# Patient Record
Sex: Male | Born: 1985
Health system: Southern US, Community
[De-identification: ages and names within clinical notes are randomized; demographics above are authoritative.]

## PROBLEM LIST (undated history)

## (undated) DIAGNOSIS — I10 Essential (primary) hypertension: Secondary | ICD-10-CM

## (undated) DIAGNOSIS — F17200 Nicotine dependence, unspecified, uncomplicated: Secondary | ICD-10-CM

## (undated) HISTORY — DX: Essential (primary) hypertension: I10

## (undated) HISTORY — DX: Nicotine dependence, unspecified, uncomplicated: F17.200

---

## 2008-07-20 ENCOUNTER — Emergency Department (HOSPITAL_COMMUNITY): Admission: EM | Admit: 2008-07-20 | Discharge: 2008-07-20 | Payer: Self-pay | Admitting: Emergency Medicine

## 2013-02-25 ENCOUNTER — Ambulatory Visit: Payer: Self-pay | Admitting: Family Medicine

## 2013-02-25 ENCOUNTER — Encounter: Payer: Self-pay | Admitting: Family Medicine

## 2013-03-10 ENCOUNTER — Encounter: Payer: Self-pay | Admitting: Family Medicine

## 2013-03-10 ENCOUNTER — Ambulatory Visit (INDEPENDENT_AMBULATORY_CARE_PROVIDER_SITE_OTHER): Payer: 59 | Admitting: Family Medicine

## 2013-03-10 VITALS — BP 134/91 | HR 86 | Temp 98.2°F | Ht 71.5 in | Wt 271.0 lb

## 2013-03-10 DIAGNOSIS — M25512 Pain in left shoulder: Secondary | ICD-10-CM | POA: Insufficient documentation

## 2013-03-10 DIAGNOSIS — Z720 Tobacco use: Secondary | ICD-10-CM | POA: Insufficient documentation

## 2013-03-10 DIAGNOSIS — M25562 Pain in left knee: Secondary | ICD-10-CM | POA: Insufficient documentation

## 2013-03-10 DIAGNOSIS — F1010 Alcohol abuse, uncomplicated: Secondary | ICD-10-CM

## 2013-03-10 DIAGNOSIS — M25569 Pain in unspecified knee: Secondary | ICD-10-CM

## 2013-03-10 DIAGNOSIS — F1090 Alcohol use, unspecified, uncomplicated: Secondary | ICD-10-CM | POA: Insufficient documentation

## 2013-03-10 DIAGNOSIS — M25519 Pain in unspecified shoulder: Secondary | ICD-10-CM

## 2013-03-10 DIAGNOSIS — F17200 Nicotine dependence, unspecified, uncomplicated: Secondary | ICD-10-CM | POA: Insufficient documentation

## 2013-03-10 LAB — COMPREHENSIVE METABOLIC PANEL
ALT: 40 U/L (ref 0–53)
AST: 23 U/L (ref 0–37)
CO2: 26 mEq/L (ref 19–32)
Calcium: 9.3 mg/dL (ref 8.4–10.5)
Chloride: 103 mEq/L (ref 96–112)
Potassium: 3.6 mEq/L (ref 3.5–5.3)
Sodium: 137 mEq/L (ref 135–145)
Total Protein: 6.8 g/dL (ref 6.0–8.3)

## 2013-03-10 LAB — CBC WITH DIFFERENTIAL/PLATELET
Basophils Absolute: 0 10*3/uL (ref 0.0–0.1)
Basophils Relative: 0 % (ref 0–1)
Eosinophils Relative: 1 % (ref 0–5)
HCT: 41 % (ref 39.0–52.0)
Lymphocytes Relative: 16 % (ref 12–46)
MCHC: 35.6 g/dL (ref 30.0–36.0)
Monocytes Absolute: 0.9 10*3/uL (ref 0.1–1.0)
Neutro Abs: 7.1 10*3/uL (ref 1.7–7.7)
Platelets: 236 10*3/uL (ref 150–400)
RDW: 13.2 % (ref 11.5–15.5)
WBC: 9.6 10*3/uL (ref 4.0–10.5)

## 2013-03-10 MED ORDER — NAPROXEN 500 MG PO TABS: 500.0000 mg | ORAL_TABLET | Freq: Two times a day (BID) | ORAL | Status: DC

## 2013-03-10 NOTE — Progress Notes (Addendum)
Patient ID: Joel Li, male   DOB: 12-Jul-1985, 27 y.o.   MRN: [id number]  Kevin Fenton, MD Phone: [phone]  Subjective:  Chief complaint-noted  New patient  27 year old male here to establish care. His complaints today of left shoulder and left knee pain.  Alcohol abuse States that he drinks approximately one fifth of whiskey once or twice a week on the weekends. He denies any family history of alcoholism, and he is negative x4 on the cage questions. He's not considering cutting back.  Tobacco abuse He states he smoked about a pack a day for the last 5 years, he has a history of lung cancer and  several family members. He states he has cut back some and has smoked off and on for 5 years. He is definitely thinking about quitting and wants to try her on the first year.  Left shoulder pain States that he's had left shoulder pain described as a intermittent 6-7/10 sharp nonradiating pain under his left scapula. It's worsened by lifting, and helped minimally by aspirin or ibuprofen. 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 It's been bothering him for about 6 months but has not bothered him for the last 2 weeks.   Left knee pain States that he's had knee pain for years but it's worsened in the last 2 months. He complains of a stiffness and sharp pain. He describes it as a 7-8/10 nonradiating pain that's worse across the front of his knee just the note below his patella. It's worse at the end of the day after being on his feet all day.  He denies redness and warmth of the joint. He also denies exacerbation after a long no drinking  ROS- Per HPI  Past Medical History Patient Active Problem List   Diagnosis Date Noted  . Alcohol abuse 03/10/2013  . Tobacco abuse 03/10/2013  . Left anterior knee pain 03/10/2013  . Left shoulder pain 03/10/2013    Medications- reviewed and updated Current Outpatient Prescriptions  Medication Sig Dispense  Refill  . naproxen (NAPROSYN) 500 MG tablet Take 1 tablet (500 mg total) by mouth 2 (two) times daily with a meal.  30 tablet  0   No current facility-administered medications for this visit.    Objective: BP 134/91  Pulse 86  Temp(Src) 98.2 F (36.8 C) (Oral)  Ht 5' 11.5" (1.816 m)  Wt 271 lb (122.925 kg)  BMI 37.27 kg/m2 Gen: NAD, alert, cooperative with exam HEENT: NCAT, EOMI, PERRL CV: RRR, good S1/S2, no murmur Resp: CTABL, no wheezes, non-labored Abd: SNTND, BS present, no guarding or organomegaly Ext: No edema, warm Neuro: Alert and oriented, No gross deficits MSK L shoulder full ROM, NO joint luine tenderness, no tenderness to palpation, strength 5/5 in all directions L KNee: No effusion, redness, or warmth, Full ROM, No Joint laxity, no tenderness to palpation   Assessment/Plan:  Alcohol abuse Counseled for this it level of alcohol use is risky, and consider alcohol abuse Discussed cutting back and offered support for the future. Check CMP  Left anterior knee pain Consistent with patellar tendinitis, explain that this would likely benefit from quadriceps strengthening, rest, ice, compression, and elevation. Recommended scheduled NSAIDs for one week. Recommended purchasing a knee sleeve to support his knee while he is at work. Will followup in one month to see if the pains improved, consider referral sports med if no relief then.  Left shoulder pain Currently not hurting him. Would consider overuse  tendinitis as likely cause Discussed rice protocol and scheduled the NSAIDs for now per knee pain. We'll follow  Tobacco abuse Contemplative, planning to quit Offered pharmacologic support which will think about.    Orders Placed This Encounter  Procedures  . Comprehensive metabolic panel  . CBC with Differential    Meds ordered this encounter  Medications  . naproxen (NAPROSYN) 500 MG tablet    Sig: Take 1 tablet (500 mg total) by mouth 2 (two) times  daily with a meal.    Dispense:  30 tablet    Refill:  0

## 2013-03-10 NOTE — Assessment & Plan Note (Signed)
Counseled for this it level of alcohol use is risky, and consider alcohol abuse Discussed cutting back and offered support for the future. Check CMP

## 2013-03-10 NOTE — Patient Instructions (Signed)
It was great to meet you!  Lets follow up in 1 month  I will call you if any of your labs are alarming  Try ice every 2 hours for 20 minutes for kneepain and swelling  Try a sleeve for your knee, especially at work   Take the naproxen twice daily for 7 days, then as needed.

## 2013-03-10 NOTE — Assessment & Plan Note (Signed)
Contemplative, planning to quit Offered pharmacologic support which will think about.

## 2013-03-10 NOTE — Assessment & Plan Note (Signed)
Consistent with patellar tendinitis, explain that this would likely benefit from quadriceps strengthening, rest, ice, compression, and elevation. Recommended scheduled NSAIDs for one week. Recommended purchasing a knee sleeve to support his knee while he is at work. Will followup in one month to see if the pains improved, consider referral sports med if no relief then.

## 2013-03-10 NOTE — Assessment & Plan Note (Signed)
Currently not hurting him. Would consider overuse tendinitis as likely cause Discussed rice protocol and scheduled the NSAIDs for now per knee pain. We'll follow

## 2013-03-16 ENCOUNTER — Encounter: Payer: Self-pay | Admitting: Family Medicine

## 2013-04-10 ENCOUNTER — Ambulatory Visit: Payer: 59 | Admitting: Family Medicine

## 2015-01-12 ENCOUNTER — Encounter: Payer: Self-pay | Admitting: Podiatry

## 2015-01-12 ENCOUNTER — Ambulatory Visit (INDEPENDENT_AMBULATORY_CARE_PROVIDER_SITE_OTHER): Payer: 59 | Admitting: Podiatry

## 2015-01-12 VITALS — BP 168/101 | HR 88 | Resp 12

## 2015-01-12 DIAGNOSIS — B353 Tinea pedis: Secondary | ICD-10-CM | POA: Diagnosis not present

## 2015-01-12 MED ORDER — TERBINAFINE HCL 250 MG PO TABS: 250.0000 mg | ORAL_TABLET | Freq: Every day | ORAL | Status: DC

## 2015-01-12 MED ORDER — ECONAZOLE NITRATE 1 % EX CREA: TOPICAL_CREAM | Freq: Every day | CUTANEOUS | Status: DC

## 2015-01-12 NOTE — Progress Notes (Signed)
   Subjective:    Patient ID: Joel Li, male    DOB: [birth year], 29 y.o.   MRN: [id number]  HPI   This patient presents today complaining of itchy, sweaty, scaling feet on right and left feet for the past 1 month. He says the itching and scaling has increased dramatically over the past month. This more annoying when he wears "shoes he says that he said previous itchy blisters but now they have resolved and he has more scaling in his feet. When questioned about topical medication he said he use moisturizing medication not antifungal cream she denies any other areas of these lesions other than his feet.  Review of Systems  Skin: Positive for color change and rash.       Objective:   Physical Exam  Orientated 3  Vascular: No peripheral edema noted bilaterally DP and PT pulses 2/4 bilaterally Capillary reflex immediate bilaterally  Neurological: Sensation to 10 g monofilament wire intact 4/5 right bilaterally Vibratory sensation intact bilaterally Ankle reflex equal and reactive bilaterally  Dermatological: Dry scaling skin plantarly bilaterally. The scaling is over an inflammatory base. There are no open wounds noted bilaterally the scaly encompasses the entire plantar aspect of the feet as well as the plantar toe areas  Musculoskeletal: No deformities noted bilaterally      Assessment & Plan:   Assessment: Tinea pedis, bilaterally  Plan: Rx terbinafine 250 mg #30 no refills one daily Rx Spectazole 85 g apply daily to skin on feet 30 days  Patient advised to return if symptoms do not resolve after he completes oral and topical medications

## 2015-01-12 NOTE — Patient Instructions (Signed)
Take oral terbinafine 250 mg once daily 30 days Apply topical cream once daily to the bottoms right and left feet 30 days

## 2018-12-08 ENCOUNTER — Other Ambulatory Visit: Payer: Self-pay

## 2018-12-08 ENCOUNTER — Ambulatory Visit (HOSPITAL_COMMUNITY)
Admission: EM | Admit: 2018-12-08 | Discharge: 2018-12-08 | Disposition: A | Discharge status: Home / Self Care | Payer: BC Managed Care – PPO | Attending: Family Medicine | Admitting: Family Medicine

## 2018-12-08 ENCOUNTER — Encounter (HOSPITAL_COMMUNITY): Payer: Self-pay

## 2018-12-08 DIAGNOSIS — R109 Unspecified abdominal pain: Secondary | ICD-10-CM

## 2018-12-08 DIAGNOSIS — R030 Elevated blood-pressure reading, without diagnosis of hypertension: Secondary | ICD-10-CM | POA: Diagnosis not present

## 2018-12-08 LAB — POCT URINALYSIS DIP (DEVICE)
Bilirubin Urine: NEGATIVE
Glucose, UA: NEGATIVE mg/dL
Hgb urine dipstick: NEGATIVE
Ketones, ur: NEGATIVE mg/dL
Leukocytes,Ua: NEGATIVE
Nitrite: NEGATIVE
Protein, ur: NEGATIVE mg/dL
Specific Gravity, Urine: >=1.03 (ref 1.005–1.030)
Urobilinogen, UA: 0.2 mg/dL (ref 0.0–1.0)
pH: 6 (ref 5.0–8.0)

## 2018-12-08 MED ORDER — IBUPROFEN 600 MG PO TABS: 600.0000 mg | ORAL_TABLET | Freq: Four times a day (QID) | ORAL | 0 refills | Status: DC | PRN

## 2018-12-08 NOTE — ED Provider Notes (Addendum)
[facility]    CSN: [id number] Arrival date & time: 12/08/18  [id number]      History   Chief Complaint Chief Complaint  Patient presents with  . Flank Pain    HPI Joel Li is a 33 y.o. male.   Patient presents with 2 to 3-week history of left side rib pain.  He states this has occurred intermittently over the last 3 months.  No known falls or injury.  He denies cough, shortness of breath, dysuria, abdominal pain, back pain, fever, chills, or other symptoms.  The history is provided by the patient.    History reviewed. No pertinent past medical history.  Patient Active Problem List   Diagnosis Date Noted  . Alcohol abuse 03/10/2013  . Tobacco abuse 03/10/2013  . Left anterior knee pain 03/10/2013  . Left shoulder pain 03/10/2013    History reviewed. No pertinent surgical history.     Home Medications    Prior to Admission medications   Medication Sig Start Date End Date Taking? Authorizing Provider  econazole nitrate 1 % cream Apply topically daily. Dispense 85 g tube 01/12/15   Tuchman, Richard C, DPM  ibuprofen (ADVIL) 600 MG tablet Take 1 tablet (600 mg total) by mouth every 6 (six) hours as needed. 12/08/18   Mickie Bailate, Lauralye Kinn H, NP  terbinafine (LAMISIL) 250 MG tablet Take 1 tablet (250 mg total) by mouth daily. 01/12/15   Carrington Clampuchman, Richard C, DPM    Family History Family History  Problem Relation Age of Onset  . Lung cancer Mother   . Lung cancer Father   . Hyperlipidemia Father     Social History Social History   Tobacco Use  . Smoking status: Current Every Day Smoker    Packs/day: 1.00    Types: Cigarettes  . Smokeless tobacco: Never Used  Substance Use Topics  . Alcohol use: Yes    Comment: weekly  . Drug use: No     Allergies   Penicillins   Review of Systems Review of Systems  Constitutional: Negative for chills and fever.  HENT: Negative for ear pain and sore throat.   Eyes: Negative for pain and visual disturbance.   Respiratory: Negative for cough and shortness of breath.   Cardiovascular: Negative for chest pain and palpitations.  Gastrointestinal: Negative for abdominal pain and vomiting.  Genitourinary: Positive for flank pain. Negative for discharge, dysuria, hematuria and testicular pain.  Musculoskeletal: Negative for arthralgias and back pain.  Skin: Negative for color change and rash.  Neurological: Negative for seizures and syncope.  All other systems reviewed and are negative.    Physical Exam Triage Vital Signs ED Triage Vitals  Enc Vitals Group     BP 12/08/18 0922 (!) 178/116     Pulse Rate 12/08/18 0922 89     Resp 12/08/18 0922 16     Temp 12/08/18 0922 98.1 F (36.7 C)     Temp Source 12/08/18 0922 Oral     SpO2 12/08/18 0922 98 %     Weight --      Height --      Head Circumference --      Peak Flow --      Pain Score 12/08/18 0920 4     Pain Loc --      Pain Edu? --      Excl. in GC? --    No data found.  Updated Vital Signs BP (!) 178/116 (BP Location: Left Arm)   Pulse 89  Temp 98.1 F (36.7 C) (Oral)   Resp 16   SpO2 98%   Visual Acuity Right Eye Distance:   Left Eye Distance:   Bilateral Distance:    Right Eye Near:   Left Eye Near:    Bilateral Near:     Physical Exam Vitals signs and nursing note reviewed.  Constitutional:      Appearance: He is well-developed.  HENT:     Head: Normocephalic and atraumatic.     Mouth/Throat:     Mouth: Mucous membranes are moist.     Pharynx: Oropharynx is clear.  Eyes:     Conjunctiva/sclera: Conjunctivae normal.  Neck:     Musculoskeletal: Neck supple.  Cardiovascular:     Rate and Rhythm: Normal rate and regular rhythm.     Heart sounds: Normal heart sounds. No murmur.  Pulmonary:     Effort: Pulmonary effort is normal. No respiratory distress.     Breath sounds: Normal breath sounds. No wheezing or rhonchi.  Abdominal:     General: Bowel sounds are normal.     Palpations: Abdomen is soft.      Tenderness: There is no abdominal tenderness. There is no right CVA tenderness, left CVA tenderness, guarding or rebound.  Musculoskeletal:        General: Tenderness present. No swelling or deformity.       Arms:     Comments: Mildly tender to palpation of right lower ribs.  Skin:    General: Skin is warm and dry.     Findings: No bruising, erythema or rash.  Neurological:     General: No focal deficit present.     Mental Status: He is alert and oriented to person, place, and time.     Sensory: No sensory deficit.     Motor: No weakness.      UC Treatments / Results  Labs (all labs ordered are listed, but only abnormal results are displayed) Labs Reviewed  POCT URINALYSIS DIP (DEVICE)    EKG   Radiology No results found.  Procedures Procedures (including critical care time)  Medications Ordered in UC Medications - No data to display  Initial Impression / Assessment and Plan / UC Course  I have reviewed the triage vital signs and the nursing notes.  Pertinent labs & imaging results that were available during my care of the patient were reviewed by me and considered in my medical decision making (see chart for details).   Right flank pain.  Elevated blood pressure without diagnosis of hypertension.  Urine dip negative.  Treating pain with ibuprofen.  Instructed patient to follow-up with his PCP or the one listed to have his blood pressure rechecked.  Instructed patient to go to the emergency department if he has worsening pain, will or develops new symptoms such as dizziness, palpitations, chest pain, shortness of breath.  Patient agrees with plan of care.     Final Clinical Impressions(s) / UC Diagnoses   Final diagnoses:  Flank pain  Elevated blood-pressure reading without diagnosis of hypertension     Discharge Instructions     Take the prescribed ibuprofen as needed for your discomfort.    Your blood pressure is elevated today at 178/116.  Please have this  rechecked by your primary care provider this week.  If you do not have a primary care provider, one is suggested below.    Go to the emergency department if you have worsening pain, or develop new symptoms such as dizziness, heart palpitations,  chest pain, shortness of breath, or other concerns.         ED Prescriptions    Medication Sig Dispense Auth. Provider   ibuprofen (ADVIL) 600 MG tablet Take 1 tablet (600 mg total) by mouth every 6 (six) hours as needed. 30 tablet Mickie Bail, NP     Controlled Substance Prescriptions [place] Controlled Substance Registry consulted? Not Applicable   Mickie Bail, NP 12/08/18 1000    Mickie Bail, NP 12/08/18 1049

## 2018-12-08 NOTE — Discharge Instructions (Signed)
Take the prescribed ibuprofen as needed for your discomfort.    Your blood pressure is elevated today at 178/116.  Please have this rechecked by your primary care provider this week.  If you do not have a primary care provider, one is suggested below.    Go to the emergency department if you have worsening pain, or develop new symptoms such as dizziness, heart palpitations, chest pain, shortness of breath, or other concerns.

## 2018-12-08 NOTE — ED Triage Notes (Signed)
Patient presents to Urgent Care with complaints of left flank pain since 2-3 weeks ago. Patient reports the original injury was 2-3 months ago but now it has been more constant, worse with movement.

## 2018-12-10 ENCOUNTER — Encounter: Payer: Self-pay | Admitting: Internal Medicine

## 2018-12-10 ENCOUNTER — Ambulatory Visit: Payer: BC Managed Care – PPO | Admitting: Internal Medicine

## 2018-12-10 ENCOUNTER — Other Ambulatory Visit: Payer: Self-pay

## 2018-12-10 VITALS — BP 151/107 | HR 86 | Temp 98.2°F | Ht 69.0 in | Wt 321.2 lb

## 2018-12-10 DIAGNOSIS — E669 Obesity, unspecified: Secondary | ICD-10-CM

## 2018-12-10 DIAGNOSIS — R0781 Pleurodynia: Secondary | ICD-10-CM | POA: Diagnosis not present

## 2018-12-10 DIAGNOSIS — Z6841 Body Mass Index (BMI) 40.0 and over, adult: Secondary | ICD-10-CM | POA: Diagnosis not present

## 2018-12-10 DIAGNOSIS — Z79899 Other long term (current) drug therapy: Secondary | ICD-10-CM

## 2018-12-10 DIAGNOSIS — Z0000 Encounter for general adult medical examination without abnormal findings: Secondary | ICD-10-CM | POA: Insufficient documentation

## 2018-12-10 DIAGNOSIS — Z720 Tobacco use: Secondary | ICD-10-CM

## 2018-12-10 DIAGNOSIS — I10 Essential (primary) hypertension: Secondary | ICD-10-CM

## 2018-12-10 LAB — POCT GLYCOSYLATED HEMOGLOBIN (HGB A1C): Hemoglobin A1C: 5.2 % (ref 4.0–5.6)

## 2018-12-10 LAB — GLUCOSE, CAPILLARY: Glucose-Capillary: 99 mg/dL (ref 70–99)

## 2018-12-10 MED ORDER — LOSARTAN POTASSIUM 25 MG PO TABS: 25.0000 mg | ORAL_TABLET | Freq: Every day | ORAL | 0 refills | Status: DC

## 2018-12-10 NOTE — Patient Instructions (Addendum)
It was a pleasure to see you today Joel Li. Please make the following changes:  During this visit you were diagnosed with hypertension. I have started you on a medication called losartan 25mg  which I would like for you to take daily. Please make sure to exercise daily and eat healthy. You will be called from our nutritionist. Please follow up in 2 weeks.   If you have any questions or concerns, please call our clinic at [phone] between 9am-5pm and after hours call [phone] and ask for the internal medicine resident on call. If you feel you are having a medical emergency please call 911.   Thank you, we look forward to help you remain healthy!  Lars Mage, MD Internal Medicine PGY3   Losartan tablets What is this medicine? LOSARTAN (loe SAR tan) is used to treat high blood pressure and to reduce the risk of stroke in certain patients. This drug also slows the progression of kidney disease in patients with diabetes. This medicine may be used for other purposes; ask your health care provider or pharmacist if you have questions. COMMON BRAND NAME(S): Cozaar What should I tell my health care provider before I take this medicine? They need to know if you have any of these conditions:  heart failure  kidney or liver disease  an unusual or allergic reaction to losartan, other medicines, foods, dyes, or preservatives  pregnant or trying to get pregnant  breast-feeding How should I use this medicine? Take this medicine by mouth with a glass of water. Follow the directions on the prescription label. This medicine can be taken with or without food. Take your doses at regular intervals. Do not take your medicine more often than directed. Talk to your pediatrician regarding the use of this medicine in children. Special care may be needed. Overdosage: If you think you have taken too much of this medicine contact a poison control center or emergency room at once. NOTE: This medicine is  only for you. Do not share this medicine with others. What if I miss a dose? If you miss a dose, take it as soon as you can. If it is almost time for your next dose, take only that dose. Do not take double or extra doses. What may interact with this medicine?  blood pressure medicines  diuretics, especially triamterene, spironolactone, or amiloride  fluconazole  NSAIDs, medicines for pain and inflammation, like ibuprofen or naproxen  potassium salts or potassium supplements  rifampin This list may not describe all possible interactions. Give your health care provider a list of all the medicines, herbs, non-prescription drugs, or dietary supplements you use. Also tell them if you smoke, drink alcohol, or use illegal drugs. Some items may interact with your medicine. What should I watch for while using this medicine? Visit your doctor or health care professional for regular checks on your progress. Check your blood pressure as directed. Ask your doctor or health care professional what your blood pressure should be and when you should contact him or her. Call your doctor or health care professional if you notice an irregular or fast heart beat. Women should inform their doctor if they wish to become pregnant or think they might be pregnant. There is a potential for serious side effects to an unborn child, particularly in the second or third trimester. Talk to your health care professional or pharmacist for more information. You may get drowsy or dizzy. Do not drive, use machinery, or do anything that needs mental alertness  until you know how this drug affects you. Do not stand or sit up quickly, especially if you are an older patient. This reduces the risk of dizzy or fainting spells. Alcohol can make you more drowsy and dizzy. Avoid alcoholic drinks. Avoid salt substitutes unless you are told otherwise by your doctor or health care professional. Do not treat yourself for coughs, colds, or pain  while you are taking this medicine without asking your doctor or health care professional for advice. Some ingredients may increase your blood pressure. What side effects may I notice from receiving this medicine? Side effects that you should report to your doctor or health care professional as soon as possible:  confusion, dizziness, light headedness or fainting spells  decreased amount of urine passed  difficulty breathing or swallowing, hoarseness, or tightening of the throat  fast or irregular heart beat, palpitations, or chest pain  skin rash, itching  swelling of your face, lips, tongue, hands, or feet Side effects that usually do not require medical attention (report to your doctor or health care professional if they continue or are bothersome):  cough  decreased sexual function or desire  headache  nasal congestion or stuffiness  nausea or stomach pain  sore or cramping muscles This list may not describe all possible side effects. Call your doctor for medical advice about side effects. You may report side effects to FDA at 1-800-FDA-1088. Where should I keep my medicine? Keep out of the reach of children. Store at room temperature between 15 and 30 degrees C (59 and 86 degrees F). Protect from light. Keep container tightly closed. Throw away any unused medicine after the expiration date. NOTE: This sheet is a summary. It may not cover all possible information. If you have questions about this medicine, talk to your doctor, pharmacist, or health care provider.  2020 Elsevier/Gold Standard (2007-05-30 16:42:18)

## 2018-12-10 NOTE — Progress Notes (Deleted)
   CC: Hypertension management  HPI:  Mr.San Gurganus is a 33 y.o. with no known past medical history presents to establish care for management of hypertension. Please see problem based charting for evaluation, assessment, and plan.  The patient was seen in [facility] emergency room on 12/08/18 for 3 month history of left sided rib pain that was thought to be due to muscle spasm. During the visit the patient's blood pressure was elevated at 178/116.   Assessment and plan  The patient has two readings of elevated blood pressure separated in time which leads to a diagnosis of hypertension. Will check for comorbidity: diabetes, electrolyte changes, renal dysfunction, hepatic dysfunction.   Secondary causes of hypertension include obesity, tobacco use.   -cmp -cbc -a1c -smoking cessation   Healthcare main -influenza vaccine was given during this encounter   No past medical history on file.    Review of Systems:  ***  Physical Exam:  There were no vitals filed for this visit. ***  Assessment & Plan:   See Encounters Tab for problem based charting.  Patient {GC/GE:3044014::"discussed with","seen with"} Dr. {NAMES:[id number]::"Butcher","Granfortuna","E. Hoffman","Mullen","Narendra","Raines","Vincent"}

## 2018-12-10 NOTE — Progress Notes (Signed)
New Patient Office Visit  Subjective:  Patient ID: Joel Li, male    DOB: [birth year]  Age: 33 y.o. MRN: [id number]  CC:  Chief Complaint  Patient presents with  . Hypertension    HPI Matther Labell presents for with no known past medical history presents to establish care for management of hypertension. Please see problem based charting for evaluation, assessment, and plan.  No past medical history on file.  No past surgical history on file.  Family History  Problem Relation Age of Onset  . Lung cancer Mother   . Lung cancer Father   . Hyperlipidemia Father   just father lung cancer  No htn  No dm  Brother with clotting disorder   Social History   Socioeconomic History  . Marital status: Single    Spouse name: Not on file  . Number of children: Not on file  . Years of education: Not on file  . Highest education level: Not on file  Occupational History  . Not on file  Social Needs  . Financial resource strain: Not on file  . Food insecurity    Worry: Not on file    Inability: Not on file  . Transportation needs    Medical: Not on file    Non-medical: Not on file  Tobacco Use  . Smoking status: Current Every Day Smoker    Packs/day: 1.00    Types: Cigarettes  . Smokeless tobacco: Never Used  Substance and Sexual Activity  . Alcohol use: Yes    Comment: weekly  . Drug use: No  . Sexual activity: Not on file  Lifestyle  . Physical activity    Days per week: Not on file    Minutes per session: Not on file  . Stress: Not on file  Relationships  . Social Herbalist on phone: Not on file    Gets together: Not on file    Attends religious service: Not on file    Active member of club or organization: Not on file    Attends meetings of clubs or organizations: Not on file    Relationship status: Not on file  . Intimate partner violence    Fear of current or ex partner: Not on file    Emotionally abused: Not on file    Physically abused: Not  on file    Forced sexual activity: Not on file  Other Topics Concern  . Not on file  Social History Narrative  . Not on file   1ppd for the past 63yrs  Tequila once week approx 5-6 shots  ivdu none   Works as Freight forwarder at [employer] [place]  Constitutional: Negative for chills and fever.  Respiratory: Negative for apnea and chest tightness.   Cardiovascular: Negative for chest pain.  Gastrointestinal: Negative for abdominal pain.  Musculoskeletal: Positive for back pain.  Neurological: Negative for dizziness.  Psychiatric/Behavioral: Negative for agitation, behavioral problems and confusion.    Objective:   Today's Vitals: BP (!) 162/117 (BP Location: Left Arm, Patient Position: Sitting, Cuff Size: Large)   Pulse 93   Temp 98.2 F (36.8 C) (Oral)   Ht 5\' 9"  (1.753 m)   Wt (!) 321 lb 3.2 oz (145.7 kg)   SpO2 98% Comment: room air  BMI 47.43 kg/m   Physical Exam  Constitutional: Appears well-developed and well-nourished. No distress.  HENT:  Head: Normocephalic and atraumatic.  Eyes: Conjunctivae are normal.  Cardiovascular: Normal  rate, regular rhythm and normal heart sounds.  Respiratory: Effort normal and breath sounds normal. No respiratory distress. No wheezes.  GI: Soft. Bowel sounds are normal. No distension. There is no tenderness.  Musculoskeletal: No edema.  Neurological: Is alert.  Skin: Not diaphoretic. No erythema.  Psychiatric: Normal mood and affect. Behavior is normal. Judgment and thought content normal.    Assessment & Plan:   Problem List Items Addressed This Visit    None      Outpatient Encounter Medications as of 12/10/2018  Medication Sig  . econazole nitrate 1 % cream Apply topically daily. Dispense 85 g tube  . ibuprofen (ADVIL) 600 MG tablet Take 1 tablet (600 mg total) by mouth every 6 (six) hours as needed.  . terbinafine (LAMISIL) 250 MG tablet Take 1 tablet (250 mg total) by mouth daily.   No  facility-administered encounter medications on file as of 12/10/2018.    Discussed with Dr. Heide SparkNarendra   Follow-up: No follow-ups on file.   Lorenso CourierVahini Raynell Scott, MD Internal Medicine PGY3 [phone] 12/10/2018, 11:44 AM

## 2018-12-10 NOTE — Assessment & Plan Note (Signed)
The patient was seen in [facility] emergency room on 12/08/18 for 3 month history of left sided rib pain that was thought to be due to muscle spasm. During the visit the patient's blood pressure was elevated at 178/116. During today's visit the patient's blood pressure is 160/117 and repeat 151/107  Assessment and plan  The patient has two readings of elevated blood pressure separated in time which leads to a diagnosis of hypertension. Will check for comorbidity: diabetes, electrolyte changes, renal dysfunction, hepatic dysfunction.   Secondary causes of hypertension include obesity, tobacco use.   -cmp -cbc -a1c -smoking cessation  -Referral to nutrition -started losartan 25mg  qd

## 2018-12-10 NOTE — Assessment & Plan Note (Signed)
Patient is anxious about getting influenza vaccine and said he may consider getting it at next visit

## 2018-12-11 LAB — CBC
Hematocrit: 42.6 % (ref 37.5–51.0)
Hemoglobin: 14.8 g/dL (ref 13.0–17.7)
MCH: 30.6 pg (ref 26.6–33.0)
MCHC: 34.7 g/dL (ref 31.5–35.7)
MCV: 88 fL (ref 79–97)
Platelets: 264 10*3/uL (ref 150–450)
RBC: 4.83 x10E6/uL (ref 4.14–5.80)
RDW: 12.6 % (ref 11.6–15.4)
WBC: 8.2 10*3/uL (ref 3.4–10.8)

## 2018-12-11 LAB — CMP14 + ANION GAP
ALT: 35 IU/L (ref 0–44)
AST: 18 IU/L (ref 0–40)
Albumin/Globulin Ratio: 1.6 (ref 1.2–2.2)
Albumin: 4.2 g/dL (ref 4.0–5.0)
Alkaline Phosphatase: 89 IU/L (ref 39–117)
Anion Gap: 15 mmol/L (ref 10.0–18.0)
BUN/Creatinine Ratio: 19 (ref 9–20)
BUN: 14 mg/dL (ref 6–20)
Bilirubin Total: 0.3 mg/dL (ref 0.0–1.2)
CO2: 22 mmol/L (ref 20–29)
Calcium: 9.4 mg/dL (ref 8.7–10.2)
Chloride: 105 mmol/L (ref 96–106)
Creatinine, Ser: 0.74 mg/dL — ABNORMAL LOW (ref 0.76–1.27)
GFR calc Af Amer: 139 mL/min/{1.73_m2} (ref >59)
GFR calc non Af Amer: 120 mL/min/{1.73_m2} (ref >59)
Globulin, Total: 2.6 g/dL (ref 1.5–4.5)
Glucose: 99 mg/dL (ref 65–99)
Potassium: 4.1 mmol/L (ref 3.5–5.2)
Sodium: 142 mmol/L (ref 134–144)
Total Protein: 6.8 g/dL (ref 6.0–8.5)

## 2018-12-11 NOTE — Progress Notes (Signed)
Internal Medicine Clinic Attending  Case discussed with Dr. Chundi at the time of the visit.  We reviewed the resident's history and exam and pertinent patient test results.  I agree with the assessment, diagnosis, and plan of care documented in the resident's note. 

## 2018-12-24 ENCOUNTER — Encounter: Payer: BC Managed Care – PPO | Admitting: Dietician

## 2019-01-16 ENCOUNTER — Encounter: Payer: Self-pay | Admitting: Internal Medicine

## 2019-01-16 ENCOUNTER — Encounter: Payer: Self-pay | Admitting: Dietician

## 2019-01-16 ENCOUNTER — Ambulatory Visit (INDEPENDENT_AMBULATORY_CARE_PROVIDER_SITE_OTHER): Payer: BC Managed Care – PPO | Admitting: Dietician

## 2019-01-16 ENCOUNTER — Ambulatory Visit: Payer: BC Managed Care – PPO | Admitting: Internal Medicine

## 2019-01-16 ENCOUNTER — Other Ambulatory Visit: Payer: Self-pay

## 2019-01-16 VITALS — BP 157/96 | HR 94 | Wt 318.4 lb

## 2019-01-16 DIAGNOSIS — Z720 Tobacco use: Secondary | ICD-10-CM

## 2019-01-16 DIAGNOSIS — Z713 Dietary counseling and surveillance: Secondary | ICD-10-CM

## 2019-01-16 DIAGNOSIS — Z6841 Body Mass Index (BMI) 40.0 and over, adult: Secondary | ICD-10-CM | POA: Diagnosis not present

## 2019-01-16 DIAGNOSIS — I10 Essential (primary) hypertension: Secondary | ICD-10-CM

## 2019-01-16 NOTE — Patient Instructions (Signed)
Hi Mr. Flammer,  Dennis Bast are doing a great job making lifestyle changes!  I am happy to support you and your family.  I like your idea of eating something in the morning for energy with you coffee and snacking and eating more fruit and veggies to increase your potassium intake to lower your blood pressure.  Please make an appointment for follow up in the same day you see the doctor. It is suggested to have 2-4 visits in 6 months to help with making changes more lasting.   Butch Penny [phone]

## 2019-01-16 NOTE — Assessment & Plan Note (Signed)
Discussed smoking cessation. Discussed nicotine replacement. Patient is not ready to quit today.

## 2019-01-16 NOTE — Progress Notes (Signed)
  Medical Nutrition Therapy :  Appt start time: 0945 end time:  1025. 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 He helps with the food shopping and cooking at home. He has recently started drinking water and some juice instead of soda and this has resulted in weight loss Preferred Learning Style: No preference indicated  Learning Readiness: Change in progress  ANTHROPOMETRICS: Estimated body mass index is 47.02 kg/m as calculated from the following:   Height as of 12/10/18: _0  (1.753 m).   Weight as of an earlier encounter on 01/16/19: 318 lb 6.4 oz (144.4 kg).  WEIGHT HISTORY:  Wt Readings from Last 5 Encounters:  01/16/19 (!) 318 lb 6.4 oz (144.4 kg)  12/10/18 (!) 321 lb 3.2 oz (145.7 kg)  03/10/13 271 lb (122.9 kg)    SLEEP: need to assess at future visit  MEDICATIONS:  Current Outpatient Medications on File Prior to Visit  Medication Sig Dispense Refill  . ibuprofen (ADVIL) 600 MG tablet Take 1 tablet (600 mg total) by mouth every 6 (six) hours as needed. 30 tablet 0  . losartan (COZAAR) 25 MG tablet Take 1 tablet (25 mg total) by mouth daily. 90 tablet 0   No current facility-administered medications on file prior to visit.     BLOOD SUGAR: Lab Results  Component Value Date   HGBA1C 5.2 12/10/2018    DIETARY INTAKE: Usual eating pattern includes 2 meals and 2 snacks per day. Everyday foods include mac and cheese, chicken sandwiches, fruits, green beans, meatloaf.   Dining Out (times/week): daily 24-hr recall:  B ( AM): coffee 1-2 cups with a little sugar and cream  L ( PM): out- chicken sandwich, corn or mac and cheese D ( PM): meatloaf, mashed potatoes and corn. He goes back for more veggies  Beverages: water, apple juice, coffee Usual physical activity: need to assess at  future visit   BMR = 2,373 Calories/day  Daily calorie needs based on activity level Activity Level Calorie Sedentary: little or no exercise 2,847 Exercise 1-3 times/week  3,263 Exercise 4-5 times/week  3,476 Daily exercise or intense exercise 3-4 times/week     3,678 Intense exercise 6-7 times/week 4,093 Very intense exercise daily, or physical job 4,508  Exercise: 15-30 minutes of elevated heart rate activity. Intense exercise: 45-120 minutes of elevated heart rate activity. Very intense exercise: 2+ hours of elevated heart rate activity.   Progress Towards Goal(s):  Some progress.   Nutritional Diagnosis:  NB-1.1 Food and nutrition-related knowledge deficit As related to lack of training on meal planning.  As evidenced by his report.    Intervention:  Nutrition education on meal planning to lower blood pressure and for overall health.. Action Goal:eat something for breakfast, increase potassium intake  Outcome goal: improved blood pressure Coordination of care: none   Teaching Method Utilized: Visual, Auditory,Hands on Handouts given during visit include:after visit summary, potassium, sodium and plate method Barriers to learning/adherence to lifestyle change: competing values Demonstrated degree of understanding via:  Teach Back   Monitoring/Evaluation:  Dietary intake, exercise, and body weight in 6 week(s). Debera Lat, RD 01/16/2019 4:37 PM.

## 2019-01-16 NOTE — Progress Notes (Signed)
   CC: high blood pressure  HPI:  [name] is a 33 y.o. recently diagnosed with hypertension.  Please see primary charting for details of presentation, assessment, and plan.  No past medical history on file. Review of Systems: Review of Systems  Constitutional: Negative for chills and fever.  Eyes: Negative for blurred vision and double vision.      Physical Exam:  Vitals:   01/16/19 0947  BP: (!) 157/96  Pulse: 94  SpO2: 98%  Weight: (!) 318 lb 6.4 oz (144.4 kg)   Physical Exam Constitutional:      General: He is not in acute distress.    Appearance: Normal appearance.  Cardiovascular:     Heart sounds: No murmur. No friction rub. No gallop.   Pulmonary:     Breath sounds: Normal breath sounds. No wheezing, rhonchi or rales.  Skin:    General: Skin is warm and dry.  Neurological:     Mental Status: He is alert.  Psychiatric:        Mood and Affect: Mood normal.        Behavior: Behavior normal.      Assessment & Plan:   See Encounters Tab for problem based charting.  Patient seen with Dr. Evette Doffing

## 2019-01-16 NOTE — Assessment & Plan Note (Signed)
Patient here for follow-up after recent diagnosis of hypertension.  BP 157/96.  Patient reports he did not start his losartan because he was working in town.  He has never taken any medication everyday and scared of side effects. Patient counseled on long term complications of high blood pressure. In addition exercise, low sodium diet , and smoking cessation were discussed. Patient is meeting with nutrition. He is not ready to quit smoking, but expresses understanding of health risk. - Start Losartan 25 mg  - Follow up in 6 weeks

## 2019-01-16 NOTE — Progress Notes (Signed)
Internal Medicine Clinic Attending  I saw and evaluated the patient.  I personally confirmed the key portions of the history and exam documented by Dr. Steen and I reviewed pertinent patient test results.  The assessment, diagnosis, and plan were formulated together and I agree with the documentation in the resident's note.     

## 2019-01-16 NOTE — Patient Instructions (Addendum)
Thank you for trusting me with your care. To recap, today we discussed the following:  Blood pressure - Start Losartan 25 mg - Work on consuming less than 2,400 mg of sodium per day  Smoking Cessation - Let us know when you are ready to quit, you can do it! We can help!   Follow-up in 6 weeks for BP check  My best,  Tamsen Snider, MD

## 2019-03-04 ENCOUNTER — Ambulatory Visit: Payer: BC Managed Care – PPO | Admitting: Internal Medicine

## 2019-03-04 ENCOUNTER — Other Ambulatory Visit: Payer: Self-pay

## 2019-03-04 ENCOUNTER — Encounter: Payer: Self-pay | Admitting: Internal Medicine

## 2019-03-04 VITALS — BP 157/101 | HR 93 | Temp 98.2°F | Ht 69.0 in | Wt 327.8 lb

## 2019-03-04 DIAGNOSIS — E669 Obesity, unspecified: Secondary | ICD-10-CM | POA: Diagnosis not present

## 2019-03-04 DIAGNOSIS — Z6841 Body Mass Index (BMI) 40.0 and over, adult: Secondary | ICD-10-CM

## 2019-03-04 DIAGNOSIS — G4733 Obstructive sleep apnea (adult) (pediatric): Secondary | ICD-10-CM | POA: Insufficient documentation

## 2019-03-04 DIAGNOSIS — I10 Essential (primary) hypertension: Secondary | ICD-10-CM

## 2019-03-04 DIAGNOSIS — Z9189 Other specified personal risk factors, not elsewhere classified: Secondary | ICD-10-CM | POA: Insufficient documentation

## 2019-03-04 DIAGNOSIS — Z79899 Other long term (current) drug therapy: Secondary | ICD-10-CM

## 2019-03-04 DIAGNOSIS — R0683 Snoring: Secondary | ICD-10-CM | POA: Diagnosis not present

## 2019-03-04 DIAGNOSIS — Z720 Tobacco use: Secondary | ICD-10-CM

## 2019-03-04 NOTE — Assessment & Plan Note (Signed)
Discussed smoking cessation.  He is not ready to quit today. Patient's father died of lung cancer, and he is considering quitting.  Assessment: Tobacco use disorder Plan: Revisit at next appointment,

## 2019-03-04 NOTE — Assessment & Plan Note (Signed)
Patient for follow-up after starting losartan 25 mg.  Patient reports his wife has been checking his blood pressure at home and she is a Marine scientist.  The blood pressures have been better than today's BP 152/92, but he cannot give specific numbers.  He forgot to take his losartan last night, but has noy forgotten otherwise.  Assessment: Hypertension  Plan:   -Continue losartan 25 mg -Daily log of blood pressure for 2 weeks. -Telehealth visit with [facility] (my clinic days are cancelled for the month)  to report BP, BP greater than 140/90 then consider going up to Losartan 50 mg.  - In addition , I have ordered a sleep study for suspected OSA.

## 2019-03-04 NOTE — Assessment & Plan Note (Signed)
Patient reports episodes of snoring at night and waking up.  BMI is 48 .  He drinks alcohol before bed. He is being treated for hypertension  Assessment: Risk for OSA Plan: Home sleep test

## 2019-03-04 NOTE — Patient Instructions (Signed)
Thank you for trusting me with your care. To recap, today we discussed the following:  High Blood Pressure - Please have your wife take your blood pressure for 2 weeks and record the numbers -I put a note in for you to be seen in 2 weeks via telehealth.   Presumed obstructive sleep apnea - We will order a home sleep study, I think you could benefit.  Keep thinking about quitting tobacco use.   My best,  Dr.Zandon Talton

## 2019-03-04 NOTE — Progress Notes (Signed)
   CC: high blood pressure and snoring  HPI:  [name] is a 33 y.o. with past medical history below who presents with high blood pressure and episodes of snoring. Please see problem based charting for details of presentation, assessment, and plan.    Past Medical History:  Diagnosis Date  . Hypertension   . Tobacco use disorder    Review of Systems:  Review of Systems  Constitutional: Negative for chills and fever.  Respiratory: Negative for shortness of breath.   Cardiovascular: Negative for chest pain.     Physical Exam:  Vitals:   03/04/19 1531 03/04/19 1554  BP: (!) 152/92 (!) 157/101  Pulse: 92 93  Temp: 98.2 F (36.8 C)   TempSrc: Oral   SpO2: 98%   Weight: (!) 327 lb 12.8 oz (148.7 kg)   Height: 5\' 9"  (1.753 m)    Physical Exam Constitutional:      Appearance: He is obese. He is not ill-appearing.  HENT:     Head: Normocephalic and atraumatic.  Eyes:     General: No scleral icterus.       Right eye: No discharge.        Left eye: No discharge.  Cardiovascular:     Rate and Rhythm: Normal rate and regular rhythm.  Pulmonary:     Effort: Pulmonary effort is normal.     Breath sounds: Normal breath sounds. No wheezing, rhonchi or rales.  Abdominal:     General: Bowel sounds are normal.     Tenderness: There is no abdominal tenderness.  Psychiatric:        Mood and Affect: Mood normal.        Behavior: Behavior normal.      Assessment & Plan:   See Encounters Tab for problem based charting.  Patient seen with Dr. Philipp Ovens

## 2019-03-08 ENCOUNTER — Other Ambulatory Visit: Payer: Self-pay | Admitting: Internal Medicine

## 2019-03-08 DIAGNOSIS — I10 Essential (primary) hypertension: Secondary | ICD-10-CM

## 2019-03-10 NOTE — Progress Notes (Signed)
Internal Medicine Clinic Attending  I saw and evaluated the patient.  I personally confirmed the key portions of the history and exam documented by Dr. Steen and I reviewed pertinent patient test results.  The assessment, diagnosis, and plan were formulated together and I agree with the documentation in the resident's note.     

## 2019-03-18 ENCOUNTER — Ambulatory Visit: Payer: BC Managed Care – PPO | Admitting: Internal Medicine

## 2019-03-18 ENCOUNTER — Other Ambulatory Visit: Payer: Self-pay

## 2019-06-24 ENCOUNTER — Ambulatory Visit: Payer: BC Managed Care – PPO

## 2019-06-24 ENCOUNTER — Ambulatory Visit: Payer: BC Managed Care – PPO | Admitting: Podiatry

## 2019-06-24 ENCOUNTER — Ambulatory Visit (INDEPENDENT_AMBULATORY_CARE_PROVIDER_SITE_OTHER): Payer: BC Managed Care – PPO

## 2019-06-24 ENCOUNTER — Encounter: Payer: Self-pay | Admitting: Podiatry

## 2019-06-24 ENCOUNTER — Other Ambulatory Visit: Payer: Self-pay

## 2019-06-24 VITALS — Temp 98.0°F

## 2019-06-24 DIAGNOSIS — S99922A Unspecified injury of left foot, initial encounter: Secondary | ICD-10-CM

## 2019-06-24 DIAGNOSIS — S90122A Contusion of left lesser toe(s) without damage to nail, initial encounter: Secondary | ICD-10-CM

## 2019-06-24 NOTE — Progress Notes (Signed)
Subjective:   Patient ID: Joel Li, male   DOB: 34 y.o.   MRN: [id number]   HPI Patient presents stating that he jammed his left fifth digit and is worried he broke something.  Stated he jammed it on a door last Friday and its been sore.  Patient smokes 1 pack/day and is obese   Review of Systems  All other systems reviewed and are negative.       Objective:  Physical Exam Vitals and nursing note reviewed.  Constitutional:      Appearance: He is well-developed.  Pulmonary:     Effort: Pulmonary effort is normal.  Musculoskeletal:        General: Normal range of motion.  Skin:    General: Skin is warm.  Neurological:     Mental Status: He is alert.     Neurovascular status intact with pain of the left fifth digit at the proximal phalanx with bruising noted and mild swelling     Assessment:  Probable fracture left fifth digit     Plan:  H&P reviewed condition and at this point applied a buddy pad to the toe explained fracture and the fact is not through the joint on x-ray  X-rays indicate there is a fracture of the base of the left proximal phalanx fifth digit left that is not through the joint

## 2020-04-04 ENCOUNTER — Ambulatory Visit: Payer: BC Managed Care – PPO | Admitting: Student

## 2020-04-04 ENCOUNTER — Encounter: Payer: Self-pay | Admitting: Student

## 2020-04-04 ENCOUNTER — Other Ambulatory Visit: Payer: Self-pay

## 2020-04-04 VITALS — BP 153/108 | HR 96 | Temp 98.1°F | Wt 325.5 lb

## 2020-04-04 DIAGNOSIS — Z0000 Encounter for general adult medical examination without abnormal findings: Secondary | ICD-10-CM

## 2020-04-04 DIAGNOSIS — Z6841 Body Mass Index (BMI) 40.0 and over, adult: Secondary | ICD-10-CM | POA: Diagnosis not present

## 2020-04-04 DIAGNOSIS — Z114 Encounter for screening for human immunodeficiency virus [HIV]: Secondary | ICD-10-CM

## 2020-04-04 DIAGNOSIS — Z720 Tobacco use: Secondary | ICD-10-CM

## 2020-04-04 DIAGNOSIS — Z9189 Other specified personal risk factors, not elsewhere classified: Secondary | ICD-10-CM

## 2020-04-04 DIAGNOSIS — I10 Essential (primary) hypertension: Secondary | ICD-10-CM | POA: Diagnosis not present

## 2020-04-04 DIAGNOSIS — Z1159 Encounter for screening for other viral diseases: Secondary | ICD-10-CM

## 2020-04-04 DIAGNOSIS — Z7185 Encounter for immunization safety counseling: Secondary | ICD-10-CM

## 2020-04-04 LAB — POCT GLYCOSYLATED HEMOGLOBIN (HGB A1C): Hemoglobin A1C: 5.3 % (ref 4.0–5.6)

## 2020-04-04 LAB — GLUCOSE, CAPILLARY: Glucose-Capillary: 103 mg/dL — ABNORMAL HIGH (ref 70–99)

## 2020-04-04 MED ORDER — LOSARTAN POTASSIUM 50 MG PO TABS: 50.0000 mg | ORAL_TABLET | Freq: Every day | ORAL | 11 refills | Status: DC

## 2020-04-04 MED ORDER — AMLODIPINE BESYLATE 5 MG PO TABS: 5.0000 mg | ORAL_TABLET | Freq: Every day | ORAL | 11 refills | Status: DC

## 2020-04-04 NOTE — Progress Notes (Signed)
   CC: BP follow up  HPI:  [name] is a 35 y.o. with history listed below presents for follow up of hypertension. Please refer to problem based charting for further details and assessment and plan of current problem and chronic medical conditions.   Past Medical History:  Diagnosis Date  . Hypertension   . Tobacco use disorder    Review of Systems:  Negative as per HPI  Physical Exam:  Vitals:   04/04/20 1452  BP: (!) 176/109  Pulse: 100  Temp: 98.1 F (36.7 C)  TempSrc: Oral  SpO2: 97%  Weight: (!) 325 lb 8 oz (147.6 kg)   Physical Exam Constitutional:      Appearance: Normal appearance. He is obese.  HENT:     Head: Normocephalic and atraumatic.  Eyes:     Extraocular Movements: Extraocular movements intact.     Conjunctiva/sclera: Conjunctivae normal.     Pupils: Pupils are equal, round, and reactive to light.  Cardiovascular:     Rate and Rhythm: Normal rate and regular rhythm.     Heart sounds: No murmur heard.   Pulmonary:     Effort: Pulmonary effort is normal.     Breath sounds: Normal breath sounds.  Abdominal:     General: Abdomen is flat. Bowel sounds are normal.     Palpations: Abdomen is soft.  Musculoskeletal:     Right lower leg: No edema.     Left lower leg: No edema.  Skin:    General: Skin is warm and dry.     Capillary Refill: Capillary refill takes less than 2 seconds.  Neurological:     General: No focal deficit present.     Mental Status: He is alert and oriented to person, place, and time.  Psychiatric:        Mood and Affect: Mood normal.        Behavior: Behavior normal.      Assessment & Plan:   See Encounters Tab for problem based charting.  Patient discussed with Dr. Antony Contras

## 2020-04-04 NOTE — Patient Instructions (Addendum)
It was a pleasure seeing you in clinic. Today we discussed:   High blood pressure Please start taking losartan 50 mg daily and amlodipine 5 mg daily I have also ordered some blood work to check and will call you with the results.  Lifestyle modifications such as weight loss, exercise, and stopping smoking can help with blood pressure. I have also ordered a sleep study as sleep apnea can also contribute to having high blood pressure.   If you have any questions or concerns, please call our clinic at [phone] between 9am-5pm and after hours call [phone] and ask for the internal medicine resident on call. If you feel you are having a medical emergency please call 911.   Thank you, we look forward to helping you remain healthy!  Please follow up in 4 weeks to have you blood pressure rechecked.  DASH Eating Plan DASH stands for "Dietary Approaches to Stop Hypertension." The DASH eating plan is a healthy eating plan that has been shown to reduce high blood pressure (hypertension). It may also reduce your risk for type 2 diabetes, heart disease, and stroke. The DASH eating plan may also help with weight loss. What are tips for following this plan?  General guidelines  Avoid eating more than 2,300 mg (milligrams) of salt (sodium) a day. If you have hypertension, you may need to reduce your sodium intake to 1,500 mg a day.  Limit alcohol intake to no more than 1 drink a day for nonpregnant women and 2 drinks a day for men. One drink equals 12 oz of beer, 5 oz of wine, or 1 oz of hard liquor.  Work with your health care provider to maintain a healthy body weight or to lose weight. Ask what an ideal weight is for you.  Get at least 30 minutes of exercise that causes your heart to beat faster (aerobic exercise) most days of the week. Activities may include walking, swimming, or biking.  Work with your health care provider or diet and nutrition specialist (dietitian) to adjust your eating  plan to your individual calorie needs. Reading food labels   Check food labels for the amount of sodium per serving. Choose foods with less than 5 percent of the Daily Value of sodium. Generally, foods with less than 300 mg of sodium per serving fit into this eating plan.  To find whole grains, look for the word "whole" as the first word in the ingredient list. Shopping  Buy products labeled as "low-sodium" or "no salt added."  Buy fresh foods. Avoid canned foods and premade or frozen meals. Cooking  Avoid adding salt when cooking. Use salt-free seasonings or herbs instead of table salt or sea salt. Check with your health care provider or pharmacist before using salt substitutes.  Do not fry foods. Cook foods using healthy methods such as baking, boiling, grilling, and broiling instead.  Cook with heart-healthy oils, such as olive, canola, soybean, or sunflower oil. Meal planning  Eat a balanced diet that includes: ? 5 or more servings of fruits and vegetables each day. At each meal, try to fill half of your plate with fruits and vegetables. ? Up to 6-8 servings of whole grains each day. ? Less than 6 oz of lean meat, poultry, or fish each day. A 3-oz serving of meat is about the same size as a deck of cards. One egg equals 1 oz. ? 2 servings of low-fat dairy each day. ? A serving of nuts, seeds, or beans 5 times  each week. ? Heart-healthy fats. Healthy fats called Omega-3 fatty acids are found in foods such as flaxseeds and coldwater fish, like sardines, salmon, and mackerel.  Limit how much you eat of the following: ? Canned or prepackaged foods. ? Food that is high in trans fat, such as fried foods. ? Food that is high in saturated fat, such as fatty meat. ? Sweets, desserts, sugary drinks, and other foods with added sugar. ? Full-fat dairy products.  Do not salt foods before eating.  Try to eat at least 2 vegetarian meals each week.  Eat more home-cooked food and less  restaurant, buffet, and fast food.  When eating at a restaurant, ask that your food be prepared with less salt or no salt, if possible. What foods are recommended? The items listed may not be a complete list. Talk with your dietitian about what dietary choices are best for you. Grains Whole-grain or whole-wheat bread. Whole-grain or whole-wheat pasta. Brown rice. [place]row. Bulgur. Whole-grain and low-sodium cereals. Pita bread. Low-fat, low-sodium crackers. Whole-wheat flour tortillas. Vegetables Fresh or frozen vegetables (raw, steamed, roasted, or grilled). Low-sodium or reduced-sodium tomato and vegetable juice. Low-sodium or reduced-sodium tomato sauce and tomato paste. Low-sodium or reduced-sodium canned vegetables. Fruits All fresh, dried, or frozen fruit. Canned fruit in natural juice (without added sugar). Meat and other protein foods Skinless chicken or [place]. Ground chicken or [place]. Pork with fat trimmed off. Fish and seafood. Egg whites. Dried beans, peas, or lentils. Unsalted nuts, nut butters, and seeds. Unsalted canned beans. Lean cuts of beef with fat trimmed off. Low-sodium, lean deli meat. Dairy Low-fat (1%) or fat-free (skim) milk. Fat-free, low-fat, or reduced-fat cheeses. Nonfat, low-sodium ricotta or cottage cheese. Low-fat or nonfat yogurt. Low-fat, low-sodium cheese. Fats and oils Soft margarine without trans fats. Vegetable oil. Low-fat, reduced-fat, or light mayonnaise and salad dressings (reduced-sodium). Canola, safflower, olive, soybean, and sunflower oils. Avocado. Seasoning and other foods Herbs. Spices. Seasoning mixes without salt. Unsalted popcorn and pretzels. Fat-free sweets. What foods are not recommended? The items listed may not be a complete list. Talk with your dietitian about what dietary choices are best for you. Grains Baked goods made with fat, such as croissants, muffins, or some breads. Dry pasta or rice meal packs. Vegetables Creamed or  fried vegetables. Vegetables in a cheese sauce. Regular canned vegetables (not low-sodium or reduced-sodium). Regular canned tomato sauce and paste (not low-sodium or reduced-sodium). Regular tomato and vegetable juice (not low-sodium or reduced-sodium). Angie Fava. Olives. Fruits Canned fruit in a light or heavy syrup. Fried fruit. Fruit in cream or butter sauce. Meat and other protein foods Fatty cuts of meat. Ribs. Fried meat. Berniece Salines. Sausage. Bologna and other processed lunch meats. Salami. Fatback. Hotdogs. Bratwurst. Salted nuts and seeds. Canned beans with added salt. Canned or smoked fish. Whole eggs or egg yolks. Chicken or [place] with skin. Dairy Whole or 2% milk, cream, and half-and-half. Whole or full-fat cream cheese. Whole-fat or sweetened yogurt. Full-fat cheese. Nondairy creamers. Whipped toppings. Processed cheese and cheese spreads. Fats and oils Butter. Stick margarine. Lard. Shortening. Ghee. Bacon fat. Tropical oils, such as coconut, palm kernel, or palm oil. Seasoning and other foods Salted popcorn and pretzels. Onion salt, garlic salt, seasoned salt, table salt, and sea salt. Worcestershire sauce. Tartar sauce. Barbecue sauce. Teriyaki sauce. Soy sauce, including reduced-sodium. Steak sauce. Canned and packaged gravies. Fish sauce. Oyster sauce. Cocktail sauce. Horseradish that you find on the shelf. Ketchup. Mustard. Meat flavorings and tenderizers. Bouillon cubes. Hot sauce  and Tabasco sauce. Premade or packaged marinades. Premade or packaged taco seasonings. Relishes. Regular salad dressings. Where to find more information:  National Heart, Lung, and Blood Institute: PopSteam.is  American Heart Association: www.heart.org Summary  The DASH eating plan is a healthy eating plan that has been shown to reduce high blood pressure (hypertension). It may also reduce your risk for type 2 diabetes, heart disease, and stroke.  With the DASH eating plan, you should limit salt  (sodium) intake to 2,300 mg a day. If you have hypertension, you may need to reduce your sodium intake to 1,500 mg a day.  When on the DASH eating plan, aim to eat more fresh fruits and vegetables, whole grains, lean proteins, low-fat dairy, and heart-healthy fats.  Work with your health care provider or diet and nutrition specialist (dietitian) to adjust your eating plan to your individual calorie needs. This information is not intended to replace advice given to you by your health care provider. Make sure you discuss any questions you have with your health care provider. Document Revised: 03/01/2017 Document Reviewed: 03/12/2016 Elsevier Patient Education  2020 [employer].

## 2020-04-05 DIAGNOSIS — Z7185 Encounter for immunization safety counseling: Secondary | ICD-10-CM

## 2020-04-05 DIAGNOSIS — E669 Obesity, unspecified: Secondary | ICD-10-CM | POA: Insufficient documentation

## 2020-04-05 HISTORY — DX: Encounter for immunization safety counseling: Z71.85

## 2020-04-05 LAB — HIV ANTIBODY (ROUTINE TESTING W REFLEX): HIV Screen 4th Generation wRfx: NONREACTIVE

## 2020-04-05 LAB — LIPID PANEL
Chol/HDL Ratio: 4.9 ratio (ref 0.0–5.0)
Cholesterol, Total: 211 mg/dL — ABNORMAL HIGH (ref 100–199)
HDL: 43 mg/dL (ref >39)
LDL Chol Calc (NIH): 116 mg/dL — ABNORMAL HIGH (ref 0–99)
Triglycerides: 300 mg/dL — ABNORMAL HIGH (ref 0–149)
VLDL Cholesterol Cal: 52 mg/dL — ABNORMAL HIGH (ref 5–40)

## 2020-04-05 LAB — BMP8+ANION GAP
Anion Gap: 16 mmol/L (ref 10.0–18.0)
BUN/Creatinine Ratio: 11 (ref 9–20)
BUN: 11 mg/dL (ref 6–20)
CO2: 23 mmol/L (ref 20–29)
Calcium: 9.5 mg/dL (ref 8.7–10.2)
Chloride: 101 mmol/L (ref 96–106)
Creatinine, Ser: 0.97 mg/dL (ref 0.76–1.27)
GFR calc Af Amer: 116 mL/min/{1.73_m2} (ref >59)
GFR calc non Af Amer: 101 mL/min/{1.73_m2} (ref >59)
Glucose: 91 mg/dL (ref 65–99)
Potassium: 3.6 mmol/L (ref 3.5–5.2)
Sodium: 140 mmol/L (ref 134–144)

## 2020-04-05 LAB — HEPATITIS C ANTIBODY: Hep C Virus Ab: <0.1 s/co ratio (ref 0.0–0.9)

## 2020-04-05 NOTE — Assessment & Plan Note (Signed)
Currently trying to cute back. Had been smoking about 1 pack a day since age 35, but now down to half a pack a day. Encouraged her continue with smoking cessation offered cessation aids which he is not interested in today, but may consider in future if he is having difficulty.    Plan Revisit at next appointment

## 2020-04-05 NOTE — Assessment & Plan Note (Signed)
Patient unvaccinated for COVID-19 and influenza, Recommended he get vaccinated as COVID cases have been increasing and currently in flu season. Discussed risks and benefits of vaccines. Patient is uninterested in getting the vaccine today. States is not afraid of getting the vaccine and has no questions. Let patient know we are happy to answer any questions or provide resources regarding vaccines should he have any in the future.

## 2020-04-05 NOTE — Progress Notes (Signed)
Internal Medicine Clinic Attending ? ?Case discussed with Dr. Liang  At the time of the visit.  We reviewed the resident?s history and exam and pertinent patient test results.  I agree with the assessment, diagnosis, and plan of care documented in the resident?s note. ? ?

## 2020-04-05 NOTE — Assessment & Plan Note (Signed)
>>  ASSESSMENT AND PLAN FOR OBESITY WRITTEN ON 04/05/2020 12:43 PM BY Quincy Simmonds, MD  Patient with BMI >48. He is currently attempting to walk more and make diet changes to lose weight. Encouraged him to continue. Previous A1c wnl no prior lipid panel will check today.  Plan - lipid profile - A1c

## 2020-04-05 NOTE — Assessment & Plan Note (Signed)
Patient with BMI >48. He is currently attempting to walk more and make diet changes to lose weight. Encouraged him to continue. Previous A1c wnl no prior lipid panel will check today.  Plan - lipid profile - A1c

## 2020-04-05 NOTE — Assessment & Plan Note (Addendum)
Today's Vitals   04/04/20 1452 04/04/20 1530  BP: (!) 176/109 (!) 153/108  Pulse: 100 96  Temp: 98.1 F (36.7 C)   TempSrc: Oral   SpO2: 97%   Weight: (!) 325 lb 8 oz (147.6 kg)    Patient reports has has not taken any antihypertensives since a couple months after previous visit. Had no difficulty or side effects with medication. States he does not monitor BP at home, but has been asymptomatic over the past year.  He denies chest pain, dyspnea, n/v/d, headache, vision changes, numbness  Tingling, weakness, lightheadedness.   Patient on exam with severe uncontrolled hypertension with some improvement on repeat measurement. Likely this has been chronically elevated as he has not been on medication. Denies any barriers to taking medication and is agreeable to restarting medical therapy. Also advised that lifestyle modifications including weight loss, exercise, diet modification, smoking cessation can improve BP. Patient states he is attempting to walk more a void fatty foods to lose weight and trying to cut back on smoking. Encouraged patient on continue with these. OSA may also be contributing to hypertension. Will reorder prior home sleep study. Emphasized importance of follow up and BP control to avoid complications such as renal damage, mi, stroke, patient understanding and is agreeable to making a follow up appointment.  Plan  Start Losartan 50 mg daily, amlodipine 5 mg daily BMP today lifestyle modifications Tele follow up BP in 4 weeks

## 2020-04-05 NOTE — Assessment & Plan Note (Signed)
Patient interested in HIV and hep C screening. Will order today  Will consider tetanus vaccine at next visit

## 2020-04-05 NOTE — Assessment & Plan Note (Signed)
Continues to report snoring, waking up with chocking sensation, respiratory arrest during sleep reported by wife, and waking up unrefreshed. Patient did not get sleep study prior, still interested in getting a hope sleep study.   Plan - Home sleep study

## 2020-04-05 NOTE — Addendum Note (Signed)
Addended by: Burnell Blanks on: 04/05/2020 02:32 PM   Modules accepted: Level of Service

## 2020-04-13 ENCOUNTER — Other Ambulatory Visit: Payer: BC Managed Care – PPO

## 2020-04-13 DIAGNOSIS — Z20822 Contact with and (suspected) exposure to covid-19: Secondary | ICD-10-CM | POA: Diagnosis not present

## 2020-04-15 LAB — NOVEL CORONAVIRUS, NAA: SARS-CoV-2, NAA: NOT DETECTED

## 2020-04-15 LAB — SARS-COV-2, NAA 2 DAY TAT

## 2020-05-05 ENCOUNTER — Encounter: Payer: BC Managed Care – PPO | Admitting: Internal Medicine

## 2020-10-04 ENCOUNTER — Encounter: Payer: Self-pay | Admitting: *Deleted

## 2020-12-30 ENCOUNTER — Other Ambulatory Visit: Payer: Self-pay

## 2020-12-30 ENCOUNTER — Emergency Department (HOSPITAL_COMMUNITY): Payer: BC Managed Care – PPO

## 2020-12-30 ENCOUNTER — Emergency Department (HOSPITAL_COMMUNITY)
Admission: EM | Admit: 2020-12-30 | Discharge: 2020-12-31 | Disposition: A | Discharge status: Home / Self Care | Payer: BC Managed Care – PPO | Attending: Emergency Medicine | Admitting: Emergency Medicine

## 2020-12-30 ENCOUNTER — Ambulatory Visit (HOSPITAL_COMMUNITY)
Admission: EM | Admit: 2020-12-30 | Discharge: 2020-12-30 | Disposition: A | Discharge status: Home / Self Care | Payer: BC Managed Care – PPO | Attending: Physician Assistant | Admitting: Physician Assistant

## 2020-12-30 ENCOUNTER — Encounter (HOSPITAL_COMMUNITY): Payer: Self-pay | Admitting: *Deleted

## 2020-12-30 ENCOUNTER — Encounter (HOSPITAL_COMMUNITY): Payer: Self-pay | Admitting: Emergency Medicine

## 2020-12-30 DIAGNOSIS — R1013 Epigastric pain: Secondary | ICD-10-CM | POA: Diagnosis not present

## 2020-12-30 DIAGNOSIS — R1010 Upper abdominal pain, unspecified: Secondary | ICD-10-CM | POA: Insufficient documentation

## 2020-12-30 DIAGNOSIS — F17210 Nicotine dependence, cigarettes, uncomplicated: Secondary | ICD-10-CM | POA: Diagnosis not present

## 2020-12-30 DIAGNOSIS — I10 Essential (primary) hypertension: Secondary | ICD-10-CM | POA: Insufficient documentation

## 2020-12-30 DIAGNOSIS — Z79899 Other long term (current) drug therapy: Secondary | ICD-10-CM | POA: Diagnosis not present

## 2020-12-30 DIAGNOSIS — E876 Hypokalemia: Secondary | ICD-10-CM | POA: Diagnosis not present

## 2020-12-30 DIAGNOSIS — R195 Other fecal abnormalities: Secondary | ICD-10-CM

## 2020-12-30 DIAGNOSIS — R0789 Other chest pain: Secondary | ICD-10-CM | POA: Diagnosis not present

## 2020-12-30 DIAGNOSIS — R079 Chest pain, unspecified: Secondary | ICD-10-CM | POA: Diagnosis not present

## 2020-12-30 DIAGNOSIS — K5900 Constipation, unspecified: Secondary | ICD-10-CM

## 2020-12-30 LAB — CBC WITH DIFFERENTIAL/PLATELET
Abs Immature Granulocytes: 0.05 10*3/uL (ref 0.00–0.07)
Basophils Absolute: 0 10*3/uL (ref 0.0–0.1)
Basophils Relative: 0 %
Eosinophils Absolute: 0 10*3/uL (ref 0.0–0.5)
Eosinophils Relative: 0 %
HCT: 45.2 % (ref 39.0–52.0)
Hemoglobin: 16 g/dL (ref 13.0–17.0)
Immature Granulocytes: 0 %
Lymphocytes Relative: 9 %
Lymphs Abs: 1.3 10*3/uL (ref 0.7–4.0)
MCH: 31.4 pg (ref 26.0–34.0)
MCHC: 35.4 g/dL (ref 30.0–36.0)
MCV: 88.8 fL (ref 80.0–100.0)
Monocytes Absolute: 1.1 10*3/uL — ABNORMAL HIGH (ref 0.1–1.0)
Monocytes Relative: 7 %
Neutro Abs: 13.2 10*3/uL — ABNORMAL HIGH (ref 1.7–7.7)
Neutrophils Relative %: 84 %
Platelets: 250 10*3/uL (ref 150–400)
RBC: 5.09 MIL/uL (ref 4.22–5.81)
RDW: 12.3 % (ref 11.5–15.5)
WBC: 15.7 10*3/uL — ABNORMAL HIGH (ref 4.0–10.5)
nRBC: 0 % (ref 0.0–0.2)

## 2020-12-30 LAB — COMPREHENSIVE METABOLIC PANEL
ALT: 28 U/L (ref 0–44)
AST: 21 U/L (ref 15–41)
Albumin: 4.1 g/dL (ref 3.5–5.0)
Alkaline Phosphatase: 72 U/L (ref 38–126)
Anion gap: 12 (ref 5–15)
BUN: 11 mg/dL (ref 6–20)
CO2: 24 mmol/L (ref 22–32)
Calcium: 9.3 mg/dL (ref 8.9–10.3)
Chloride: 99 mmol/L (ref 98–111)
Creatinine, Ser: 1.06 mg/dL (ref 0.61–1.24)
GFR, Estimated: >60 mL/min (ref >60)
Glucose, Bld: 129 mg/dL — ABNORMAL HIGH (ref 70–99)
Potassium: 3 mmol/L — ABNORMAL LOW (ref 3.5–5.1)
Sodium: 135 mmol/L (ref 135–145)
Total Bilirubin: 0.6 mg/dL (ref 0.3–1.2)
Total Protein: 7.5 g/dL (ref 6.5–8.1)

## 2020-12-30 LAB — URINALYSIS, ROUTINE W REFLEX MICROSCOPIC
Bacteria, UA: NONE SEEN
Bilirubin Urine: NEGATIVE
Glucose, UA: NEGATIVE mg/dL
Hgb urine dipstick: NEGATIVE
Ketones, ur: NEGATIVE mg/dL
Leukocytes,Ua: NEGATIVE
Nitrite: NEGATIVE
Protein, ur: 100 mg/dL — AB
Specific Gravity, Urine: 1.021 (ref 1.005–1.030)
pH: 5 (ref 5.0–8.0)

## 2020-12-30 LAB — LIPASE, BLOOD: Lipase: 50 U/L (ref 11–51)

## 2020-12-30 MED ORDER — LIDOCAINE VISCOUS HCL 2 % MT SOLN
15.0000 mL | Freq: Once | OROMUCOSAL | Status: AC
  Administered 2020-12-30: 15 mL via ORAL
  Filled 2020-12-30: qty 15

## 2020-12-30 MED ORDER — MORPHINE SULFATE (PF) 4 MG/ML IV SOLN
4.0000 mg | Freq: Once | INTRAVENOUS | Status: AC
Start: 2020-12-30 — End: 2020-12-30
  Administered 2020-12-30: 4 mg via INTRAVENOUS
  Filled 2020-12-30: qty 1

## 2020-12-30 MED ORDER — ALUM & MAG HYDROXIDE-SIMETH 200-200-20 MG/5ML PO SUSP
30.0000 mL | Freq: Once | ORAL | Status: AC
  Administered 2020-12-30: 30 mL via ORAL
  Filled 2020-12-30: qty 30

## 2020-12-30 MED ORDER — LACTATED RINGERS IV BOLUS
1000.0000 mL | Freq: Once | INTRAVENOUS | Status: AC
  Administered 2020-12-30: 1000 mL via INTRAVENOUS

## 2020-12-30 MED ORDER — PANTOPRAZOLE SODIUM 40 MG IV SOLR
40.0000 mg | Freq: Once | INTRAVENOUS | Status: AC
  Administered 2020-12-30: 40 mg via INTRAVENOUS
  Filled 2020-12-30: qty 40

## 2020-12-30 MED ORDER — LOSARTAN POTASSIUM 50 MG PO TABS
50.0000 mg | ORAL_TABLET | Freq: Once | ORAL | Status: AC
  Administered 2020-12-30: 50 mg via ORAL
  Filled 2020-12-30: qty 1

## 2020-12-30 MED ORDER — POTASSIUM CHLORIDE 10 MEQ/100ML IV SOLN
10.0000 meq | Freq: Once | INTRAVENOUS | Status: AC
  Administered 2020-12-30: 10 meq via INTRAVENOUS
  Filled 2020-12-30: qty 100

## 2020-12-30 MED ORDER — AMLODIPINE BESYLATE 5 MG PO TABS
5.0000 mg | ORAL_TABLET | Freq: Once | ORAL | Status: AC
  Administered 2020-12-30: 5 mg via ORAL
  Filled 2020-12-30: qty 1

## 2020-12-30 NOTE — ED Provider Notes (Signed)
Emergency Medicine Provider Triage Evaluation Note  Joel Li , a 35 y.o. male  was evaluated in triage.  Pt complains of epigastric pain and dark stool. Hx etoh use   Review of Systems  Positive: Abd pain, dark stool Negative: sob  Physical Exam  BP (!) 197/142 (BP Location: Left Arm)   Pulse (!) 104   Temp 98.5 F (36.9 C) (Oral)   Resp 16   Ht 5\' 9"  (1.753 m)   Wt (!) 147.5 kg   SpO2 98%   BMI 48.02 kg/m  Gen:   Awake, no distress   Resp:  Normal effort  MSK:   Moves extremities without difficulty  Other:  Epigastric ttp  Medical Decision Making  Medically screening exam initiated at 6:38 PM.  Appropriate orders placed.  Joel Li was informed that the remainder of the evaluation will be completed by another provider, this initial triage assessment does not replace that evaluation, and the importance of remaining in the ED until their evaluation is complete.     Alecia Lemming, PA-C 12/30/20 1838    01/01/21, DO 12/30/20 2035

## 2020-12-30 NOTE — ED Provider Notes (Signed)
[facility] EMERGENCY DEPARTMENT Provider Note   CSN: [id number] Arrival date & time: 12/30/20  1801     History Chief Complaint  Patient presents with   Chest Pain    Joel Li is a 35 y.o. male.  HPI This is a 35 year old male with hypertension who presents with abdominal pain.  Patient reports pain in the lower chest upper abdomen that radiates down to the belly.  Started 4 days ago after an episode of heavy drinking over the weekend.  Not associated with shortness of breath, cough, fever.  Not associated with vomiting, diarrhea, or dysuria.  Is having some constipation.       Past Medical History:  Diagnosis Date   Hypertension    Tobacco use disorder     Patient Active Problem List   Diagnosis Date Noted   Obesity 04/05/2020   Vaccine counseling 04/05/2020   At risk for obstructive sleep apnea 03/04/2019   Hypertension 12/10/2018   Healthcare maintenance 12/10/2018   Alcohol abuse 03/10/2013   Tobacco abuse 03/10/2013   Left anterior knee pain 03/10/2013   Left shoulder pain 03/10/2013    History reviewed. No pertinent surgical history.     Family History  Problem Relation Age of Onset   Lung cancer Father    Hyperlipidemia Father    Cleft palate Brother     Social History   Tobacco Use   Smoking status: Every Day    Packs/day: 0.50    Years: 10.00    Pack years: 5.00    Types: Cigarettes   Smokeless tobacco: Never   Tobacco comments:    =.5 pkpd  Vaping Use   Vaping Use: Never used  Substance Use Topics   Alcohol use: Yes    Alcohol/week: 5.0 standard drinks    Types: 5 Shots of liquor per week    Comment: weekly   Drug use: Not Currently    Types: Marijuana    Home Medications Prior to Admission medications   Medication Sig Start Date End Date Taking? Authorizing Provider  ibuprofen (ADVIL) 200 MG tablet Take 400 mg by mouth every 6 (six) hours as needed for headache or moderate pain.   Yes [provider]  amLODipine (NORVASC) 5 MG tablet Take 1 tablet (5 mg total) by mouth daily. Patient not taking: Reported on 12/30/2020 04/04/20 04/04/21  Quincy Simmonds, MD  losartan (COZAAR) 50 MG tablet Take 1 tablet (50 mg total) by mouth daily. Patient not taking: Reported on 12/30/2020 04/04/20 04/04/21  Quincy Simmonds, MD    Allergies    Penicillins  Review of Systems   Review of Systems  Constitutional:  Negative for chills and fever.  HENT:  Negative for ear pain and sore throat.   Eyes:  Negative for pain and visual disturbance.  Respiratory:  Negative for cough and shortness of breath.   Cardiovascular:  Negative for chest pain and palpitations.  Gastrointestinal:  Positive for abdominal pain and constipation. Negative for vomiting.  Genitourinary:  Negative for dysuria and hematuria.  Musculoskeletal:  Negative for arthralgias and back pain.  Skin:  Negative for color change and rash.  Neurological:  Negative for seizures and syncope.  All other systems reviewed and are negative.  Physical Exam Updated Vital Signs BP (!) 203/127   Pulse 94   Temp 98.5 F (36.9 C) (Oral)   Resp 10   Ht 5\' 9"  (1.753 m)   Wt (!) 147.5 kg   SpO2 94%   BMI  48.02 kg/m   Physical Exam Vitals and nursing note reviewed.  Constitutional:      Appearance: He is well-developed.  HENT:     Head: Normocephalic and atraumatic.  Eyes:     Conjunctiva/sclera: Conjunctivae normal.  Cardiovascular:     Rate and Rhythm: Normal rate and regular rhythm.     Heart sounds: No murmur heard. Pulmonary:     Effort: Pulmonary effort is normal. No respiratory distress.     Breath sounds: Normal breath sounds.  Chest:     Chest wall: No tenderness.  Abdominal:     Palpations: Abdomen is soft.     Tenderness: There is abdominal tenderness. There is no guarding or rebound.     Comments: Tenderness in the epigastrium without rebound or guarding.  Abdomen is soft without distention.  Musculoskeletal:     Cervical back:  Neck supple.  Skin:    General: Skin is warm and dry.  Neurological:     Mental Status: He is alert.    ED Results / Procedures / Treatments   Labs (all labs ordered are listed, but only abnormal results are displayed) Labs Reviewed  CBC WITH DIFFERENTIAL/PLATELET - Abnormal; Notable for the following components:      Result Value   WBC 15.7 (*)    Neutro Abs 13.2 (*)    Monocytes Absolute 1.1 (*)    All other components within normal limits  COMPREHENSIVE METABOLIC PANEL - Abnormal; Notable for the following components:   Potassium 3.0 (*)    Glucose, Bld 129 (*)    All other components within normal limits  URINALYSIS, ROUTINE W REFLEX MICROSCOPIC - Abnormal; Notable for the following components:   Protein, ur 100 (*)    All other components within normal limits  LIPASE, BLOOD    EKG EKG Interpretation  Date/Time:  Friday December 30 2020 18:12:21 EDT Ventricular Rate:  99 PR Interval:  158 QRS Duration: 102 QT Interval:  390 QTC Calculation: 500 R Axis:   -6 Text Interpretation: Normal sinus rhythm Prolonged QT Abnormal ECG NSR, prolonged QTc Confirmed by Coralee Pesa [phone]) on 12/30/2020 9:17:41 PM  Radiology DG CHEST PORT 1 VIEW  Result Date: 12/30/2020 CLINICAL DATA:  Chest pain. EXAM: PORTABLE CHEST 1 VIEW COMPARISON:  None. FINDINGS: The heart size and mediastinal contours are within normal limits. Both lungs are clear. The visualized skeletal structures are unremarkable. IMPRESSION: No active disease. Electronically Signed   By: Aram Candela M.D.   On: 12/30/2020 21:24    Procedures Procedures   Medications Ordered in ED Medications  potassium chloride 10 mEq in 100 mL IVPB (10 mEq Intravenous New Bag/Given 12/30/20 2144)  lactated ringers bolus 1,000 mL (1,000 mLs Intravenous New Bag/Given 12/30/20 2117)  morphine 4 MG/ML injection 4 mg (4 mg Intravenous Given 12/30/20 2114)  pantoprazole (PROTONIX) injection 40 mg (40 mg Intravenous Given 12/30/20  2135)  alum & mag hydroxide-simeth (MAALOX/MYLANTA) 200-200-20 MG/5ML suspension 30 mL (30 mLs Oral Given 12/30/20 2137)    And  lidocaine (XYLOCAINE) 2 % viscous mouth solution 15 mL (15 mLs Oral Given 12/30/20 2137)    ED Course  I have reviewed the triage vital signs and the nursing notes.  Pertinent labs & imaging results that were available during my care of the patient were reviewed by me and considered in my medical decision making (see chart for details).    MDM Rules/Calculators/A&P  Patient is stable and well-appearing.  Triage note notes chest pain, however on my exam patient endorses upper abdominal pain that radiates downwards.  Items on the differential include pancreatitis, cholecystitis, ascending cholangitis, peptic ulcer disease, gastritis, viral GI illness.  Abdomen is soft with mild tenderness in the epigastrium without rebound or guarding, low suspicion for perforation.  Denies vomiting, low suspicion for obstruction.  No chest pain or shortness of breath on exam and chest x-ray is unremarkable without cardiopulmonary abnormality including pneumonia, pneumothorax, pleural effusion, or pulmonary edema. low suspicion for ACS or other cardiopulmonary abnormalities.  CMP with hypokalemia at 3, otherwise unremarkable.  Lipase is normal.  UA without evidence of UTI.  Hemoglobin is stable but white count mildly elevated at 15.7.  EKG with prolonged QTC at 500.  No evidence of acute ischemia or arrhythmia.  Will replete potassium IV and repeat EKG.  Given labs as above, high suspicion for gastritis.  We will treat pain with Maalox, viscous lidocaine, IV Protonix, and morphine.  Given a liter of fluids.  We will continue to reassess.   Patient's pain is gone on reassessment he remains hypertensive.  Given p.o. antihypertensive medicines will continue to reassess.  Repeat EKG, QT remains prolonged at 512.  Spoke with cardiology who recommends checking magnesium  and repleting to potassium greater than 4 and magnesium greater than 2 and checking a UDS.  All of the following are in process.  If after replating electrolytes, QT remains prolonged, cardiology recommends outpatient follow-up with cardiologist.  Patient remains stable at time of handoff. Plan to replete electrolytes and repeat EKG. Home with cardiology follow up.   Final Clinical Impression(s) / ED Diagnoses Final diagnoses:  Chest pain    Rx / DC Orders ED Discharge Orders     None        Doran Clay, MD 12/30/20 2345    Rozelle Logan, DO 01/03/21 1427

## 2020-12-30 NOTE — ED Triage Notes (Signed)
Pt presents with mid chest pain xs 3 days. States has been taking OTC medication for reflux/ heartburn with no relief.

## 2020-12-30 NOTE — ED Triage Notes (Signed)
Patient is being discharged from the Urgent Care and sent to the Emergency Department via POV . Per Billee Cashing PA, patient is in need of higher level of care due to Chest pain with hypertension. Patient is aware and verbalizes understanding of plan of care.  Vitals:   12/30/20 1721  BP: (!) 211/153  Pulse: 100  Resp: 18  Temp: 98.3 F (36.8 C)  SpO2: 98%

## 2020-12-30 NOTE — Discharge Instructions (Addendum)
Please report to ED immediately after leaving our office for further evaluation.

## 2020-12-30 NOTE — ED Triage Notes (Signed)
Chest pain since Tuesday  no sob nause or dizziness

## 2020-12-30 NOTE — ED Provider Notes (Signed)
[facility]    CSN: [id number] Arrival date & time: 12/30/20  1711      History   Chief Complaint Chief Complaint  Patient presents with   Chest Pain   Abdominal Pain    HPI Sanchez Hemmer is a 35 y.o. male.   Patient is here today for evaluation of chest pain/epigastric pain that started a few days ago and has become constant.  He rates his pain as a 9 out of 10.  Eating seems to make pain worse.  He states he has had decreased appetite, and has also been constipated.  He has not had a bowel movement for 2 days.  He states his last bowel movement was very dark.  He denies any blood in the stool.  He has not had any shortness of breath.  He reports that activity actually improves chest and epigastric pain.   Chest Pain Associated symptoms: abdominal pain and nausea   Associated symptoms: no fever, no shortness of breath and no vomiting   Abdominal Pain Associated symptoms: chest pain, constipation and nausea   Associated symptoms: no chills, no diarrhea, no fever, no shortness of breath and no vomiting    Past Medical History:  Diagnosis Date   Hypertension    Tobacco use disorder     Patient Active Problem List   Diagnosis Date Noted   Obesity 04/05/2020   Vaccine counseling 04/05/2020   At risk for obstructive sleep apnea 03/04/2019   Hypertension 12/10/2018   Healthcare maintenance 12/10/2018   Alcohol abuse 03/10/2013   Tobacco abuse 03/10/2013   Left anterior knee pain 03/10/2013   Left shoulder pain 03/10/2013    History reviewed. No pertinent surgical history.     Home Medications    Prior to Admission medications   Medication Sig Start Date End Date Taking? Authorizing Provider  amLODipine (NORVASC) 5 MG tablet Take 1 tablet (5 mg total) by mouth daily. 04/04/20 04/04/21  Quincy Simmonds, MD  losartan (COZAAR) 50 MG tablet Take 1 tablet (50 mg total) by mouth daily. 04/04/20 04/04/21  Quincy Simmonds, MD    Family History Family History  Problem  Relation Age of Onset   Lung cancer Father    Hyperlipidemia Father    Cleft palate Brother     Social History Social History   Tobacco Use   Smoking status: Every Day    Packs/day: 0.50    Years: 10.00    Pack years: 5.00    Types: Cigarettes   Smokeless tobacco: Never   Tobacco comments:    =.5 pkpd  Vaping Use   Vaping Use: Never used  Substance Use Topics   Alcohol use: Yes    Alcohol/week: 5.0 standard drinks    Types: 5 Shots of liquor per week    Comment: weekly   Drug use: Not Currently    Types: Marijuana     Allergies   Penicillins   Review of Systems Review of Systems  Constitutional:  Negative for chills and fever.  Respiratory:  Negative for shortness of breath.   Cardiovascular:  Positive for chest pain.  Gastrointestinal:  Positive for abdominal pain, constipation and nausea. Negative for diarrhea and vomiting.    Physical Exam Triage Vital Signs ED Triage Vitals  Enc Vitals Group     BP 12/30/20 1721 (!) 211/153     Pulse Rate 12/30/20 1721 100     Resp 12/30/20 1721 18     Temp 12/30/20 1721 98.3 F (36.8 C)  Temp Source 12/30/20 1721 Oral     SpO2 12/30/20 1721 98 %     Weight --      Height --      Head Circumference --      Peak Flow --      Pain Score 12/30/20 1720 9     Pain Loc --      Pain Edu? --      Excl. in GC? --    No data found.  Updated Vital Signs BP (!) 211/153 (BP Location: Left Arm)   Pulse 100   Temp 98.3 F (36.8 C) (Oral)   Resp 18   SpO2 98%     Physical Exam Vitals and nursing note reviewed.  Constitutional:      General: He is not in acute distress.    Appearance: He is well-developed. He is not ill-appearing.  Cardiovascular:     Rate and Rhythm: Normal rate and regular rhythm.     Heart sounds: Normal heart sounds. No murmur heard. Pulmonary:     Breath sounds: Normal breath sounds. No wheezing, rhonchi or rales.  Skin:    General: Skin is warm and dry.  Neurological:     Mental  Status: He is alert.  Psychiatric:        Mood and Affect: Mood normal.        Behavior: Behavior normal.     UC Treatments / Results  Labs (all labs ordered are listed, but only abnormal results are displayed) Labs Reviewed - No data to display  EKG   Radiology No results found.  Procedures Procedures (including critical care time)  Medications Ordered in UC Medications - No data to display  Initial Impression / Assessment and Plan / UC Course  I have reviewed the triage vital signs and the nursing notes.  Pertinent labs & imaging results that were available during my care of the patient were reviewed by me and considered in my medical decision making (see chart for details).  EKG ordered-no acute findings.  Given elevated blood pressure, and reported dark tarry stools with epigastric pain recommended further evaluation in the ED with stat labs and imaging.  Patient is agreeable with this plan.  Final Clinical Impressions(s) / UC Diagnoses   Final diagnoses:  Abdominal pain, epigastric  Constipation, unspecified constipation type  Dark stools     Discharge Instructions      Please report to ED immediately after leaving our office for further evaluation.      ED Prescriptions   None    PDMP not reviewed this encounter.   Tomi Bamberger, PA-C 12/30/20 1801

## 2020-12-30 NOTE — Discharge Instructions (Addendum)
Schedule a follow up appointment with a primary care doctor to recheck your electrolytes, especially your K and Magnesium. We encourage you to also see a cardiologist for something call prolonged QT, whose number is above. Call to schedule an appointment. In the meantime each potassium rich foods. Refer to attached document with list of these foods. Take famotidine twice daily for your stomach pain. Abstain from alcohol. Return to the ED with new or worsening symptoms.

## 2020-12-31 ENCOUNTER — Telehealth: Payer: Self-pay

## 2020-12-31 LAB — POTASSIUM: Potassium: 3.1 mmol/L — ABNORMAL LOW (ref 3.5–5.1)

## 2020-12-31 LAB — RAPID URINE DRUG SCREEN, HOSP PERFORMED
Amphetamines: NOT DETECTED
Barbiturates: NOT DETECTED
Benzodiazepines: NOT DETECTED
Cocaine: NOT DETECTED
Opiates: NOT DETECTED
Tetrahydrocannabinol: NOT DETECTED

## 2020-12-31 LAB — MAGNESIUM: Magnesium: 2 mg/dL (ref 1.7–2.4)

## 2020-12-31 MED ORDER — MAGNESIUM OXIDE -MG SUPPLEMENT 400 (240 MG) MG PO TABS
800.0000 mg | ORAL_TABLET | Freq: Once | ORAL | Status: AC
  Administered 2020-12-31: 800 mg via ORAL
  Filled 2020-12-31: qty 2

## 2020-12-31 MED ORDER — FAMOTIDINE 20 MG PO TABS: 20.0000 mg | ORAL_TABLET | Freq: Two times a day (BID) | ORAL | 0 refills | Status: DC

## 2020-12-31 MED ORDER — POTASSIUM CHLORIDE CRYS ER 20 MEQ PO TBCR
40.0000 meq | EXTENDED_RELEASE_TABLET | Freq: Once | ORAL | Status: AC
  Administered 2020-12-31: 40 meq via ORAL
  Filled 2020-12-31: qty 2

## 2020-12-31 NOTE — ED Notes (Signed)
Pt reports his friend will be driving him home.A&OX4 ambulatory at d/c with independent steady gait

## 2020-12-31 NOTE — Telephone Encounter (Signed)
Received call from patient that his medication ordered did not come through to pharmacy. Called [facility] on [address]  they did not have the escript due to power outage, called in pepcid as prescribed.

## 2021-02-20 ENCOUNTER — Emergency Department (HOSPITAL_COMMUNITY): Payer: BC Managed Care – PPO

## 2021-02-20 ENCOUNTER — Other Ambulatory Visit: Payer: Self-pay

## 2021-02-20 ENCOUNTER — Emergency Department (HOSPITAL_COMMUNITY)
Admission: EM | Admit: 2021-02-20 | Discharge: 2021-02-20 | Disposition: A | Discharge status: Home / Self Care | Payer: BC Managed Care – PPO | Attending: Emergency Medicine | Admitting: Emergency Medicine

## 2021-02-20 ENCOUNTER — Encounter (HOSPITAL_COMMUNITY): Payer: Self-pay

## 2021-02-20 ENCOUNTER — Encounter: Payer: BC Managed Care – PPO | Admitting: Internal Medicine

## 2021-02-20 DIAGNOSIS — R109 Unspecified abdominal pain: Secondary | ICD-10-CM | POA: Diagnosis not present

## 2021-02-20 DIAGNOSIS — K852 Alcohol induced acute pancreatitis without necrosis or infection: Secondary | ICD-10-CM

## 2021-02-20 DIAGNOSIS — F17210 Nicotine dependence, cigarettes, uncomplicated: Secondary | ICD-10-CM | POA: Insufficient documentation

## 2021-02-20 DIAGNOSIS — R000 Tachycardia, unspecified: Secondary | ICD-10-CM | POA: Insufficient documentation

## 2021-02-20 DIAGNOSIS — R1013 Epigastric pain: Secondary | ICD-10-CM | POA: Diagnosis not present

## 2021-02-20 DIAGNOSIS — I10 Essential (primary) hypertension: Secondary | ICD-10-CM | POA: Diagnosis not present

## 2021-02-20 DIAGNOSIS — R079 Chest pain, unspecified: Secondary | ICD-10-CM | POA: Diagnosis not present

## 2021-02-20 DIAGNOSIS — Z79899 Other long term (current) drug therapy: Secondary | ICD-10-CM | POA: Diagnosis not present

## 2021-02-20 LAB — COMPREHENSIVE METABOLIC PANEL
ALT: 18 U/L (ref 0–44)
AST: 15 U/L (ref 15–41)
Albumin: 4.2 g/dL (ref 3.5–5.0)
Alkaline Phosphatase: 76 U/L (ref 38–126)
Anion gap: 11 (ref 5–15)
BUN: 14 mg/dL (ref 6–20)
CO2: 24 mmol/L (ref 22–32)
Calcium: 9.2 mg/dL (ref 8.9–10.3)
Chloride: 100 mmol/L (ref 98–111)
Creatinine, Ser: 0.71 mg/dL (ref 0.61–1.24)
GFR, Estimated: >60 mL/min (ref >60)
Glucose, Bld: 128 mg/dL — ABNORMAL HIGH (ref 70–99)
Potassium: 3.3 mmol/L — ABNORMAL LOW (ref 3.5–5.1)
Sodium: 135 mmol/L (ref 135–145)
Total Bilirubin: 0.8 mg/dL (ref 0.3–1.2)
Total Protein: 7.9 g/dL (ref 6.5–8.1)

## 2021-02-20 LAB — URINALYSIS, ROUTINE W REFLEX MICROSCOPIC
Bacteria, UA: NONE SEEN
Bilirubin Urine: NEGATIVE
Glucose, UA: NEGATIVE mg/dL
Hgb urine dipstick: NEGATIVE
Ketones, ur: NEGATIVE mg/dL
Leukocytes,Ua: NEGATIVE
Nitrite: NEGATIVE
Protein, ur: 100 mg/dL — AB
Specific Gravity, Urine: 1.023 (ref 1.005–1.030)
pH: 6 (ref 5.0–8.0)

## 2021-02-20 LAB — CBC WITH DIFFERENTIAL/PLATELET
Abs Immature Granulocytes: 0.08 10*3/uL — ABNORMAL HIGH (ref 0.00–0.07)
Basophils Absolute: 0 10*3/uL (ref 0.0–0.1)
Basophils Relative: 0 %
Eosinophils Absolute: 0.1 10*3/uL (ref 0.0–0.5)
Eosinophils Relative: 1 %
HCT: 44.1 % (ref 39.0–52.0)
Hemoglobin: 15.5 g/dL (ref 13.0–17.0)
Immature Granulocytes: 1 %
Lymphocytes Relative: 8 %
Lymphs Abs: 1.3 10*3/uL (ref 0.7–4.0)
MCH: 31.4 pg (ref 26.0–34.0)
MCHC: 35.1 g/dL (ref 30.0–36.0)
MCV: 89.5 fL (ref 80.0–100.0)
Monocytes Absolute: 1.1 10*3/uL — ABNORMAL HIGH (ref 0.1–1.0)
Monocytes Relative: 7 %
Neutro Abs: 12.5 10*3/uL — ABNORMAL HIGH (ref 1.7–7.7)
Neutrophils Relative %: 83 %
Platelets: 307 10*3/uL (ref 150–400)
RBC: 4.93 MIL/uL (ref 4.22–5.81)
RDW: 12.8 % (ref 11.5–15.5)
WBC: 15 10*3/uL — ABNORMAL HIGH (ref 4.0–10.5)
nRBC: 0 % (ref 0.0–0.2)

## 2021-02-20 LAB — LIPASE, BLOOD: Lipase: 169 U/L — ABNORMAL HIGH (ref 11–51)

## 2021-02-20 LAB — TROPONIN I (HIGH SENSITIVITY)
Troponin I (High Sensitivity): 15 ng/L (ref <18)
Troponin I (High Sensitivity): 16 ng/L (ref <18)

## 2021-02-20 MED ORDER — OXYCODONE-ACETAMINOPHEN 5-325 MG PO TABS
1.0000 | ORAL_TABLET | Freq: Four times a day (QID) | ORAL | 0 refills | Status: DC | PRN
Start: 2021-02-20 — End: 2021-11-08

## 2021-02-20 MED ORDER — LACTATED RINGERS IV BOLUS
1000.0000 mL | Freq: Once | INTRAVENOUS | Status: AC
  Administered 2021-02-20: 1000 mL via INTRAVENOUS

## 2021-02-20 MED ORDER — LABETALOL HCL 5 MG/ML IV SOLN
10.0000 mg | Freq: Once | INTRAVENOUS | Status: AC
  Administered 2021-02-20: 10 mg via INTRAVENOUS
  Filled 2021-02-20: qty 4

## 2021-02-20 MED ORDER — MORPHINE SULFATE (PF) 4 MG/ML IV SOLN
4.0000 mg | Freq: Once | INTRAVENOUS | Status: AC
  Administered 2021-02-20: 4 mg via INTRAVENOUS
  Filled 2021-02-20: qty 1

## 2021-02-20 MED ORDER — AMLODIPINE BESYLATE 10 MG PO TABS: 10.0000 mg | ORAL_TABLET | Freq: Every day | ORAL | 0 refills | Status: DC

## 2021-02-20 MED ORDER — SODIUM CHLORIDE (PF) 0.9 % IJ SOLN
INTRAMUSCULAR | Status: AC
  Filled 2021-02-20: qty 50

## 2021-02-20 MED ORDER — LIDOCAINE VISCOUS HCL 2 % MT SOLN
15.0000 mL | Freq: Once | OROMUCOSAL | Status: DC
  Filled 2021-02-20: qty 15

## 2021-02-20 MED ORDER — SODIUM CHLORIDE 0.9 % IV BOLUS: 1000.0000 mL | Freq: Once | INTRAVENOUS | Status: DC

## 2021-02-20 MED ORDER — IOHEXOL 350 MG/ML SOLN
100.0000 mL | Freq: Once | INTRAVENOUS | Status: AC | PRN
  Administered 2021-02-20: 100 mL via INTRAVENOUS

## 2021-02-20 MED ORDER — OXYCODONE-ACETAMINOPHEN 5-325 MG PO TABS
1.0000 | ORAL_TABLET | Freq: Once | ORAL | Status: AC
  Administered 2021-02-20: 1 via ORAL
  Filled 2021-02-20: qty 1

## 2021-02-20 MED ORDER — ALUM & MAG HYDROXIDE-SIMETH 200-200-20 MG/5ML PO SUSP
30.0000 mL | Freq: Once | ORAL | Status: AC
  Administered 2021-02-20: 30 mL via ORAL
  Filled 2021-02-20: qty 30

## 2021-02-20 NOTE — ED Triage Notes (Signed)
Pt reports with sharp chest pain that extends into his upper ribs and back since Friday. Pt states that the pain is constant.

## 2021-02-20 NOTE — ED Provider Notes (Signed)
[facility]EMERGENCY DEPT Provider Note   CSN: [id number] Arrival date & time: 02/20/21  2263     History Chief Complaint  Patient presents with   Chest Pain    Joel Li is a 35 y.o. male.  Patient with history of hypertension presents today with complaint of epigastric pain. He states that same began in the night on Friday and awoke him from rest, has been persistent since. He states that the pain is worse when lying flat and worse after eating. The pain is sharp in nature and radiates to his back and sides. Also states that he had a few episodes of nonbloody vomiting on Friday, with none since. States that his last bowel movement was Friday and that is abnormal for him, however he has been passing normal flatus. Patient also with history of hypertension, states that his blood pressures have been in the 200s for months, states he was planning to call his PCP to schedule an appointment for management of this but has not done so yet. He states he has not missed any doses of his home blood pressure medications. Additionally, he has a history of chronic alcohol abuse, however states he has not had a drink in 1 week. He smokes a pack every few days. Of note, patient was seen for similar symptoms on 9/30, was diagnosed with gastritis and hypokalemia with outpatient cardiology follow-up. He has not called to schedule this appointment yet. States that his pain is similar to this episode but worse than previous. Denies recent illness, fevers, chills, shortness of breath, or leg pain.  Seen today for evaluation of his epigastric abdominal pain.  Seen today for evaluation of his epigastric abdominal pain.  His  The history is provided by the patient. No language interpreter was used.  Chest Pain Associated symptoms: abdominal pain, nausea and vomiting   Associated symptoms: no cough, no fever, no headache, no shortness of breath and no weakness       Past Medical History:   Diagnosis Date   Hypertension    Tobacco use disorder     Patient Active Problem List   Diagnosis Date Noted   Obesity 04/05/2020   Vaccine counseling 04/05/2020   At risk for obstructive sleep apnea 03/04/2019   Hypertension 12/10/2018   Healthcare maintenance 12/10/2018   Alcohol abuse 03/10/2013   Tobacco abuse 03/10/2013   Left anterior knee pain 03/10/2013   Left shoulder pain 03/10/2013    History reviewed. No pertinent surgical history.     Family History  Problem Relation Age of Onset   Lung cancer Father    Hyperlipidemia Father    Cleft palate Brother     Social History   Tobacco Use   Smoking status: Every Day    Packs/day: 0.50    Years: 10.00    Pack years: 5.00    Types: Cigarettes   Smokeless tobacco: Never   Tobacco comments:    =.5 pkpd  Vaping Use   Vaping Use: Never used  Substance Use Topics   Alcohol use: Yes    Alcohol/week: 5.0 standard drinks    Types: 5 Shots of liquor per week    Comment: weekly   Drug use: Not Currently    Types: Marijuana    Home Medications Prior to Admission medications   Medication Sig Start Date End Date Taking? Authorizing Provider  amLODipine (NORVASC) 5 MG tablet Take 1 tablet (5 mg total) by mouth daily. Patient not taking: Reported on 12/30/2020  04/04/20 04/04/21  Quincy Simmonds, MD  famotidine (PEPCID) 20 MG tablet Take 1 tablet (20 mg total) by mouth 2 (two) times daily. 12/31/20   Doran Clay, MD  ibuprofen (ADVIL) 200 MG tablet Take 400 mg by mouth every 6 (six) hours as needed for headache or moderate pain.    [provider]  losartan (COZAAR) 50 MG tablet Take 1 tablet (50 mg total) by mouth daily. Patient not taking: Reported on 12/30/2020 04/04/20 04/04/21  Quincy Simmonds, MD    Allergies    Penicillins  Review of Systems   Review of Systems  Constitutional:  Negative for chills and fever.  Respiratory:  Negative for cough, shortness of breath, wheezing and stridor.    Cardiovascular:  Positive for chest pain.  Gastrointestinal:  Positive for abdominal pain, constipation, nausea and vomiting. Negative for anal bleeding, blood in stool and diarrhea.  Genitourinary:  Negative for difficulty urinating and dysuria.  Skin:  Negative for rash.  Neurological:  Negative for syncope, speech difficulty, weakness and headaches.  Psychiatric/Behavioral:  Negative for decreased concentration and dysphoric mood.   All other systems reviewed and are negative.  Physical Exam Updated Vital Signs BP (!) 221/148   Pulse (!) 109   Temp 97.8 F (36.6 C) (Oral)   Resp (!) 23   SpO2 100%   Physical Exam Vitals and nursing note reviewed.  Constitutional:      General: He is not in acute distress.    Appearance: He is well-developed. He is obese. He is not ill-appearing, toxic-appearing or diaphoretic.  HENT:     Head: Normocephalic and atraumatic.  Cardiovascular:     Rate and Rhythm: Regular rhythm. Tachycardia present.     Heart sounds: Normal heart sounds.  Pulmonary:     Effort: Pulmonary effort is normal. No tachypnea, accessory muscle usage or respiratory distress.     Breath sounds: Normal breath sounds. No stridor.  Abdominal:     General: Bowel sounds are normal. There is distension.     Palpations: Abdomen is soft.     Tenderness: There is abdominal tenderness in the epigastric area. There is no right CVA tenderness, left CVA tenderness, guarding or rebound. Negative signs include Murphy's sign, Rovsing's sign, McBurney's sign, psoas sign and obturator sign.  Musculoskeletal:        General: Normal range of motion.     Right lower leg: No tenderness. No edema.     Left lower leg: No tenderness. No edema.  Skin:    General: Skin is warm and dry.  Neurological:     General: No focal deficit present.     Mental Status: He is alert.  Psychiatric:        Mood and Affect: Mood normal.        Behavior: Behavior normal.    ED Results / Procedures /  Treatments   Labs (all labs ordered are listed, but only abnormal results are displayed) Labs Reviewed  CBC WITH DIFFERENTIAL/PLATELET - Abnormal; Notable for the following components:      Result Value   WBC 15.0 (*)    Neutro Abs 12.5 (*)    Monocytes Absolute 1.1 (*)    Abs Immature Granulocytes 0.08 (*)    All other components within normal limits  COMPREHENSIVE METABOLIC PANEL  TROPONIN I (HIGH SENSITIVITY)    EKG EKG Interpretation  Date/Time:  Monday February 20 2021 06:18:54 EST Ventricular Rate:  108 PR Interval:  156 QRS Duration: 111 QT Interval:  352 QTC Calculation: 472 R Axis:   29 Text Interpretation: Sinus tachycardia Confirmed by Tilden Fossa [phone]) on 02/20/2021 6:22:36 AM  Radiology DG Chest 2 View  Result Date: 02/20/2021 CLINICAL DATA:  35 year old male with history of chest pain. EXAM: CHEST - 2 VIEW COMPARISON:  Chest x-ray 12/30/2020. FINDINGS: Lung volumes are normal. No consolidative airspace disease. No pleural effusions. No pneumothorax. No pulmonary nodule or mass noted. Pulmonary vasculature and the cardiomediastinal silhouette are within normal limits. IMPRESSION: No radiographic evidence of acute cardiopulmonary disease. Electronically Signed   By: Trudie Reed M.D.   On: 02/20/2021 06:37    Procedures Procedures   Medications Ordered in ED Medications - No data to display  ED Course  I have reviewed the triage vital signs and the nursing notes.  Pertinent labs & imaging results that were available during my care of the patient were reviewed by me and considered in my medical decision making (see chart for details).    MDM Rules/Calculators/A&P                         Patient seen today for evaluation of his epigastric abdominal pain. He states that symptoms have been intermittent for several weeks, worse with food, and radiating to his back. History of chronic alcohol abuse also with significantly high blood pressure in the 200s  which he states is where it has been for the past several months. He endorses compliance to his hypertension medication. Patient states that his symptoms are similar to when he was diagnosed with gastritis on 9/30. Therefore will trial GI cocktail to evaluated for improvement and get labs.   Patient endorses no change in pain with GI cocktail with elevated lipase and leukocytosis. Given significant epigastric tenderness, will get CT scan.   CT reveals pancreatitis, no other acute findings, will give fluid bolus and pain medication and reassess for dispo.  Patient 0 with Alison Murray criteria for high risks associated with mortality in pancreatitis. Therefore, share decision making was implemented with patient and after extensive conversation, patient has decided that he would like to trial at home management of his symptoms. I have educated him on clear liquid diet followed by slow reintroduction of solid foods and avoiding fats and proteins while he is symptomatic. He has passed po challenge without difficulty. Will discharge home with strict return precautions. Patient is amenable with plan and educated on red flag symptoms that would prompt immediate return.  Additionally, patients blood pressure noted to be very highly despite medication compliance. Therefore, will go up on Norvasc dose and have patient have close PCP follow-up for continued management. Patient has been educated on the importance of blood pressure control. Discharged in stable condition.   This is a shared visit with supervising physician Dr. Wilkie Aye who has independently evaluated patient & provided guidance in evaluation/management/disposition, in agreement with care    Final Clinical Impression(s) / ED Diagnoses Final diagnoses:  Alcohol-induced acute pancreatitis without infection or necrosis    Rx / DC Orders ED Discharge Orders          Ordered    amLODipine (NORVASC) 10 MG tablet  Daily        02/20/21 1326     oxyCODONE-acetaminophen (PERCOCET/ROXICET) 5-325 MG tablet  Every 6 hours PRN        02/20/21 1326          An After Visit Summary was printed and given to the patient.  Silva Bandy, PA-C 02/20/21 1327    Rozelle Logan, DO 02/20/21 1544

## 2021-02-20 NOTE — Discharge Instructions (Addendum)
You were seen today for evaluation of your abdominal pain today and diagnosed with acute pancreatitis.  After discussion and shared decision making, decided to manage this on an outpatient basis.  You will need to do a clear liquid diet for the next 48 hours with plenty of oral hydration.  After 48 hours, you may slowly reintroduce solid foods but avoid protein and fatty foods until you are no longer symptomatic as these can continue to flare your pancreatitis.  And he was given you oral pain control which you may take as prescribed as needed.  Please do not drive or operate heavy machinery following taking this medication.  Return if development of new or worsening symptoms.  Additionally, your blood pressure was extremely high in the department this evening.  I have increased your Norvasc dose to help with this.  It is extremely important that you follow-up with your primary care for further management of your blood pressure as high blood pressure can be very treatment detrimental to your life.

## 2021-03-02 ENCOUNTER — Encounter: Payer: Self-pay | Admitting: Student

## 2021-03-02 ENCOUNTER — Ambulatory Visit: Payer: BC Managed Care – PPO | Admitting: Student

## 2021-03-02 VITALS — BP 148/101 | HR 110 | Temp 98.1°F | Ht 69.0 in

## 2021-03-02 DIAGNOSIS — K852 Alcohol induced acute pancreatitis without necrosis or infection: Secondary | ICD-10-CM | POA: Diagnosis not present

## 2021-03-02 DIAGNOSIS — K859 Acute pancreatitis without necrosis or infection, unspecified: Secondary | ICD-10-CM

## 2021-03-02 DIAGNOSIS — F1010 Alcohol abuse, uncomplicated: Secondary | ICD-10-CM | POA: Diagnosis not present

## 2021-03-02 HISTORY — DX: Acute pancreatitis without necrosis or infection, unspecified: K85.90

## 2021-03-02 NOTE — Assessment & Plan Note (Addendum)
Patient was seen in the ED 11/21 for new onset of abdominal pain.  CT abdomen pelvis showed acute pancreatitis without any complication.  Lipase was elevated 169.  Patient was discharged from ED because he was tolerating p.o. well.  Today, patient reports slight improvement of abdominal pain.  He reports epigastric pain after eating thus he has not been eating much in the last few days.  He does drink water adequately.  Patient denies nausea, vomiting or fever.  He endorses mild muscle cramps on the left flank that is worse at night.  States that this pain is constant and bothering him.  He has not had any alcohol since the ED visit.  Assessment and plan His pancreatitis is likely related to alcohol use.  No gallstones seen on the CT, even though this is not the best imaging modality to look for gallstones.  Physical exam is reassuring.  There is no pain to palpation of the epigastric or right upper quadrant.  No peritoneal signs or ecchymosis.  Advised patient to advance diet slowly starting with liquid diet.  He can take Tylenol as needed for pain.  Also advised him to take antacid for epigastric pain.  Will check BMP for electrolytes.  His last triglycerides was elevated at 300, will repeat today to make sure this is not the cause of his pancreatitis.  He will follow-up in 1 week.  If symptoms are not improved, consider repeat imaging.  Educated patient on alcohol cessation.  Addendum BMP unremarkable.  Triglyceride came down to 139, unlikely to be the cause of his pancreatitis. Patient reports doing better.  He was able to tolerate p.o. yesterday and had more energy today.  They will call us for any question of concern.

## 2021-03-02 NOTE — Patient Instructions (Signed)
Mr. Voong,  It was a pleasure seeing you in the clinic today.  I am sorry that you still having abdominal pain from the pancreatitis.  Please slowly advance her diet, starting with liquid diet including broth or jelly.  Make sure you drink enough fluid.  Please obtain an antacid suggest Pepcid from over-the-counter.   Please cut back on alcohol consumption which can makes the pancreatitis worse.  Please come back in 1 week,  Take care,  Dr. Cyndie Chime

## 2021-03-02 NOTE — Assessment & Plan Note (Signed)
His wife reports that she and patient drink about a fifth of liquor a day for a long time.  His last drink was before the ED visit.  Patient has not had any issue with work, relationship or the law due to alcohol consumption.  Counseled patient on the importance of alcohol cessation.  I offered to start naltrexone to help.  Patient states that he will quit on his own.  I advised both patient and his wife to work on alcohol cessation together.

## 2021-03-02 NOTE — Progress Notes (Signed)
   CC: ED follow-up for pancreatitis  HPI:  [name] is a 35 y.o. with past medical history of hypertension, Konkol use disorder who presented to the clinic today for ED follow-up on his pancreatitis.  Please see his problem based charting for detail  Past Medical History:  Diagnosis Date   Hypertension    Tobacco use disorder    Review of Systems: Per HPI  Physical Exam:  Vitals:   03/02/21 1400  BP: (!) 148/101  Pulse: (!) 110  Temp: 98.1 F (36.7 C)  TempSrc: Oral  SpO2: 100%  Height: 5\' 9"  (1.753 m)   Physical Exam Constitutional:      General: He is not in acute distress. HENT:     Head: Normocephalic.  Eyes:     General:        Right eye: No discharge.        Left eye: No discharge.     Conjunctiva/sclera: Conjunctivae normal.  Cardiovascular:     Rate and Rhythm: Normal rate and regular rhythm.     Heart sounds: No murmur heard. Pulmonary:     Effort: Pulmonary effort is normal. No respiratory distress.     Breath sounds: Normal breath sounds. No wheezing.  Abdominal:     General: Bowel sounds are normal.     Comments: Abdomen is soft, nontender to palpation.  Negative Murphy sign.  Bowel sounds present.  No ecchymosis seen.  Musculoskeletal:        General: Normal range of motion.  Skin:    General: Skin is warm.     Coloration: Skin is not jaundiced.  Neurological:     General: No focal deficit present.     Mental Status: He is alert and oriented to person, place, and time.  Psychiatric:        Mood and Affect: Mood normal.        Behavior: Behavior normal.     Assessment & Plan:   See Encounters Tab for problem based charting.  Patient discussed with Dr.  

## 2021-03-03 LAB — BMP8+ANION GAP
Anion Gap: 19 mmol/L — ABNORMAL HIGH (ref 10.0–18.0)
BUN/Creatinine Ratio: 15 (ref 9–20)
BUN: 12 mg/dL (ref 6–20)
CO2: 23 mmol/L (ref 20–29)
Calcium: 9.7 mg/dL (ref 8.7–10.2)
Chloride: 96 mmol/L (ref 96–106)
Creatinine, Ser: 0.82 mg/dL (ref 0.76–1.27)
Glucose: 104 mg/dL — ABNORMAL HIGH (ref 70–99)
Potassium: 3.9 mmol/L (ref 3.5–5.2)
Sodium: 138 mmol/L (ref 134–144)
eGFR: 117 mL/min/{1.73_m2} (ref >59)

## 2021-03-03 LAB — TRIGLYCERIDES: Triglycerides: 159 mg/dL — ABNORMAL HIGH (ref 0–149)

## 2021-03-03 NOTE — Progress Notes (Signed)
Internal Medicine Clinic Attending  Case discussed with Dr. Nguyen  At the time of the visit.  We reviewed the resident's history and exam and pertinent patient test results.  I agree with the assessment, diagnosis, and plan of care documented in the resident's note. 

## 2021-03-09 ENCOUNTER — Ambulatory Visit (INDEPENDENT_AMBULATORY_CARE_PROVIDER_SITE_OTHER): Payer: BC Managed Care – PPO | Admitting: Internal Medicine

## 2021-03-09 ENCOUNTER — Encounter: Payer: Self-pay | Admitting: Internal Medicine

## 2021-03-09 VITALS — BP 137/88 | HR 99 | Temp 98.4°F | Wt 296.3 lb

## 2021-03-09 DIAGNOSIS — I10 Essential (primary) hypertension: Secondary | ICD-10-CM | POA: Diagnosis not present

## 2021-03-09 DIAGNOSIS — Z6841 Body Mass Index (BMI) 40.0 and over, adult: Secondary | ICD-10-CM | POA: Diagnosis not present

## 2021-03-09 DIAGNOSIS — K852 Alcohol induced acute pancreatitis without necrosis or infection: Secondary | ICD-10-CM | POA: Diagnosis not present

## 2021-03-09 MED ORDER — AMLODIPINE-OLMESARTAN 10-20 MG PO TABS: 1.0000 | ORAL_TABLET | Freq: Every day | ORAL | 1 refills | Status: DC

## 2021-03-09 NOTE — Progress Notes (Signed)
   CC: Pancreatitis, hypertension  HPI:[name] is a 35 y.o. male who presents for follow up of acute pancreatitis 2 weeks ago and hypertension. Please see individual problem based A/P for details.   Review of Systems:   Review of Systems  Gastrointestinal:  Negative for abdominal pain, constipation, diarrhea, nausea and vomiting.  Neurological:  Negative for dizziness.    Physical Exam: Vitals:   03/09/21 1439  BP: (!) 140/98  Pulse: 97  Temp: 98.4 F (36.9 C)  TempSrc: Oral  SpO2: 99%  Weight: 296 lb 4.8 oz (134.4 kg)   General: appears comfortable, BMI and vitals reviewed. Elevated blood pressure.  HEENT: Conjunctiva nl , antiicteric sclerae, moist mucous membranes, no exudate or erythema Cardiovascular: Normal rate, regular rhythm.  No murmurs, rubs, or gallops Pulmonary : Equal breath sounds, No wheezes, rales, or rhonchi Abdominal: soft, nontender,  bowel sounds present  Assessment & Plan:   See Encounters Tab for problem based charting.  Patient discussed with Dr.  Sol Blazing

## 2021-03-09 NOTE — Assessment & Plan Note (Signed)
Here for follow-up of his pancreatitis due to alcohol use.  He regained his appetite 2 days ago and is no longer having any symptoms of pancreatitis.  This was his first episode of pancreatitis and was surrounding binge drinking.  He reports he has no plans to drink alcohol at this time as this pain was very bad.  We discussed recommended amounts of alcohol consumption and that he would be at increased risk for pancreatitis in the future.  No further work-up at this time.

## 2021-03-09 NOTE — Assessment & Plan Note (Signed)
Wt Readings from Last 3 Encounters:  03/09/21 296 lb 4.8 oz (134.4 kg)  12/30/20 (!) 325 lb 2.9 oz (147.5 kg)  04/04/20 (!) 325 lb 8 oz (147.6 kg)    Patient motivated for weight loss.  Recently had pancreatitis and lost a bit of weight.  He is going to continue eating healthy and start exercise routine.  We briefly spoke about medications prescribed for weight loss. He will continue with healthy lifestyle at this time and we can consider at next visit if patient is interested.

## 2021-03-09 NOTE — Assessment & Plan Note (Signed)
Hypertension: Patient's BP today is 140/98 with a goal of <140/80. The patient endorses adherence to his medication regimen.  Currently on amlodipine 10 mg and losartan 50 mg daily  Assessment/Plan: Hypertension, uncontrolled on current regimen.  We will switch to different ARB with increased dose. Stop amlodipine and losartan - amlodipine-olmesartan (AZOR) 10-20 MG tablet; Take 1 tablet by mouth daily.  Dispense: 90 tablet; Refill: 1

## 2021-03-09 NOTE — Assessment & Plan Note (Signed)
>>  ASSESSMENT AND PLAN FOR OBESITY WRITTEN ON 03/09/2021  3:18 PM BY Albertha Ghee, MD  Wt Readings from Last 3 Encounters:  03/09/21 296 lb 4.8 oz (134.4 kg)  12/30/20 (!) 325 lb 2.9 oz (147.5 kg)  04/04/20 (!) 325 lb 8 oz (147.6 kg)    Patient motivated for weight loss.  Recently had pancreatitis and lost a bit of weight.  He is going to continue eating healthy and start exercise routine.  We briefly spoke about medications prescribed for weight loss. He will continue with healthy lifestyle at this time and we can consider at next visit if patient is interested.

## 2021-03-09 NOTE — Patient Instructions (Addendum)
BP Readings from Last 3 Encounters:  03/09/21 137/88  03/02/21 (!) 148/101  02/20/21 (!) 191/126    Stop current blood pressure medications  Primary hypertension - amlodipine-olmesartan (AZOR) 10-20 MG tablet; Take 1 tablet by mouth daily.  Dispense: 90 tablet; Refill: 1

## 2021-03-13 NOTE — Progress Notes (Signed)
Internal Medicine Clinic Attending  Case discussed with Dr. Steen  At the time of the visit.  We reviewed the resident's history and exam and pertinent patient test results.  I agree with the assessment, diagnosis, and plan of care documented in the resident's note.  

## 2021-10-06 ENCOUNTER — Other Ambulatory Visit: Payer: Self-pay

## 2021-10-06 ENCOUNTER — Ambulatory Visit (INDEPENDENT_AMBULATORY_CARE_PROVIDER_SITE_OTHER): Payer: Self-pay | Admitting: Student

## 2021-10-06 ENCOUNTER — Encounter: Payer: Self-pay | Admitting: Student

## 2021-10-06 VITALS — BP 166/99 | HR 97 | Temp 97.8°F | Ht 69.0 in | Wt 292.0 lb

## 2021-10-06 DIAGNOSIS — F17210 Nicotine dependence, cigarettes, uncomplicated: Secondary | ICD-10-CM

## 2021-10-06 DIAGNOSIS — Z9189 Other specified personal risk factors, not elsewhere classified: Secondary | ICD-10-CM

## 2021-10-06 DIAGNOSIS — H7392 Unspecified disorder of tympanic membrane, left ear: Secondary | ICD-10-CM

## 2021-10-06 DIAGNOSIS — H9202 Otalgia, left ear: Secondary | ICD-10-CM

## 2021-10-06 DIAGNOSIS — Z0000 Encounter for general adult medical examination without abnormal findings: Secondary | ICD-10-CM

## 2021-10-06 DIAGNOSIS — H6592 Unspecified nonsuppurative otitis media, left ear: Secondary | ICD-10-CM | POA: Insufficient documentation

## 2021-10-06 DIAGNOSIS — Z720 Tobacco use: Secondary | ICD-10-CM

## 2021-10-06 DIAGNOSIS — I10 Essential (primary) hypertension: Secondary | ICD-10-CM

## 2021-10-06 HISTORY — DX: Unspecified nonsuppurative otitis media, left ear: H65.92

## 2021-10-06 MED ORDER — AMLODIPINE-OLMESARTAN 10-20 MG PO TABS: 1.0000 | ORAL_TABLET | Freq: Every day | ORAL | 3 refills | Status: DC

## 2021-10-06 MED ORDER — NICOTINE 14 MG/24HR TD PT24: 14.0000 mg | MEDICATED_PATCH | TRANSDERMAL | 0 refills | Status: AC

## 2021-10-06 NOTE — Assessment & Plan Note (Signed)
STOPBANG score of 7 suggest high probability of OSA.  Home sleep study was ordered but he never received any appointment.  -Reorder home sleep study

## 2021-10-06 NOTE — Patient Instructions (Addendum)
Joel Li,  It was a pleasure seeing you in the clinic today.  Here is a summary of what we talked about:  1.  Left ear issue: I saw a little bit of fluid behind your eardrum.  This could be from changing in altitude versus viral infection.  The symptoms typically resolve in about 12 weeks.  You can use nasal steroid spray if needed if you develop symptoms of allergies.  If you develop symptoms of infection to sets fever, chills, ear pain or drainage, please come back to see [place] right away.  2.  I will refill your blood pressure medication.  Please let us know if the medication is covered under your insurance.  I will check your kidney function today.  3.  I will reorder home sleep study for you.  4.  I also ordered nicotine patch to help cut back on your smoking.  Please return in 1 month for blood pressure recheck and reevaluate of your hearing symptom.  Take care  Dr. Cyndie Chime

## 2021-10-06 NOTE — Assessment & Plan Note (Signed)
Pressure elevated today at 166/99.  Patient has not been taking his medication for many months due to changing his insurance.  He has insurance with [place] [employer] now.  -Resume amlodipine-olmesartan 10-20 mg.  Patient will let us know if this medication is not covered by his new insurance -Recheck BMP now given patient is out of his medication for many months. -Return in 4 weeks for blood pressure and BMP recheck

## 2021-10-06 NOTE — Assessment & Plan Note (Addendum)
Patient endorses muffled hearing of his left ear that started 1 month ago.  There was started after his Jet[employer] strip.  States that he heard a pop sound from the right ear but hearing on the right ear is normal.  States that left ear hearing became damped and muffled.  Report only 25% of his hearing on the left side.  He denies any pain, drainage, fever, chills, rhinorrhea, postnasal drip, allergy symptoms or change in his visions.  Physical exam reveal mild effusion behind the left tympanic membrane with mild erythema.  No cerumen impaction bilaterally.  Right tympanic membrane also mildly erythematous.  No evidence of tympanic membrane perforation on exam.  No drainage of nasal cavity but mucosal membrane is mildly erythematous.  Suspect his hearing issue is from inflammation of the tympanic membrane could be secondary to change in altitude versus post viral infection.  No evidence of active infection.  These symptoms typically resolve within 12 weeks.  Advised patient to use nasal steroid spray as needed for any allergy symptoms.  Patient understands if he develops symptoms of fever, chills, ear pain or drainage, he will let us know right away.

## 2021-10-06 NOTE — Assessment & Plan Note (Signed)
He is currently smoking half pack a day.  He is motivated to quit.  -Start nicotine patch

## 2021-10-06 NOTE — Assessment & Plan Note (Signed)
Patient declined Tdap vaccine.

## 2021-10-06 NOTE — Progress Notes (Signed)
CC: Hearing issue in the left ear  HPI:  Joel Li is a 36 y.o. with past medical history pretension, tobacco use disorder who presents to the clinic for hearing issue in the left ear that started 1 month ago.  Please see problem based charting for detail  Past Medical History:  Diagnosis Date   Hypertension    Tobacco use disorder    Review of Systems:  per HPI  Physical Exam:  Vitals:   10/06/21 0956 10/06/21 1002  BP: (!) 173/86 (!) 166/99  Pulse: 99 97  Temp: 97.8 F (36.6 C)   TempSrc: Oral   SpO2: 98%   Weight: 292 lb (132.5 kg)   Height: 5\' 9"  (1.753 m)    Physical Exam Constitutional:      General: He is not in acute distress.    Appearance: He is not ill-appearing.  HENT:     Head: Normocephalic.     Right Ear: There is no impacted cerumen.     Left Ear: There is no impacted cerumen.     Ears:     Comments: Mild effusion behind left tympanic membrane noted.  No cerumen impaction bilaterally.  There was mild erythema of bilateral tympanic membrane as well.  No obvious drainage of nasal cavity but mucosal membrane is mildly erythematous.  Mouth and throat normal. Eyes:     General:        Right eye: No discharge.        Left eye: No discharge.     Conjunctiva/sclera: Conjunctivae normal.  Cardiovascular:     Rate and Rhythm: Normal rate and regular rhythm.  Pulmonary:     Effort: Pulmonary effort is normal. No respiratory distress.     Breath sounds: Normal breath sounds. No wheezing.  Musculoskeletal:        General: Normal range of motion.  Skin:    General: Skin is warm.  Neurological:     General: No focal deficit present.     Mental Status: He is alert.  Psychiatric:        Mood and Affect: Mood normal.      Assessment & Plan:   See Encounters Tab for problem based charting.  Fluid level behind tympanic membrane of left ear Patient endorses muffled hearing of his left ear that started 1 month ago.  There was started after his  Jet[employer]ki strip.  States that he heard a pop sound from the right ear but hearing on the right ear is normal.  States that left ear hearing became damped and muffled.  Report only 25% of his hearing on the left side.  He denies any pain, drainage, fever, chills, rhinorrhea, postnasal drip, allergy symptoms or change in his visions.  Physical exam reveal mild effusion behind the left tympanic membrane with mild erythema.  No cerumen impaction bilaterally.  Right tympanic membrane also mildly erythematous.  No evidence of tympanic membrane perforation on exam.  No drainage of nasal cavity but mucosal membrane is mildly erythematous.  Suspect his hearing issue is from inflammation of the tympanic membrane could be secondary to change in altitude versus post viral infection.  No evidence of active infection.  These symptoms typically resolve within 12 weeks.  Advised patient to use nasal steroid spray as needed for any allergy symptoms.  Patient understands if he develops symptoms of fever, chills, ear pain or drainage, he will let know right away.  Hypertension Pressure elevated today at 166/99.  Patient has not been  taking his medication for many months due to changing his insurance.  He has insurance with [place] [employer] now.  -Resume amlodipine-olmesartan 10-20 mg.  Patient will let us know if this medication is not covered by his new insurance -Recheck BMP now given patient is out of his medication for many months. -Return in 4 weeks for blood pressure and BMP recheck  At risk for obstructive sleep apnea STOPBANG score of 7 suggest high probability of OSA.  Home sleep study was ordered but he never received any appointment.  -Reorder home sleep study  Tobacco abuse He is currently smoking half pack a day.  He is motivated to quit.  -Start nicotine patch  Healthcare maintenance Patient declined Tdap vaccine   Patient discussed with Dr. Heide Spark

## 2021-10-07 LAB — BMP8+ANION GAP
Anion Gap: 16 mmol/L (ref 10.0–18.0)
BUN/Creatinine Ratio: 15 (ref 9–20)
BUN: 13 mg/dL (ref 6–20)
CO2: 22 mmol/L (ref 20–29)
Calcium: 9.3 mg/dL (ref 8.7–10.2)
Chloride: 98 mmol/L (ref 96–106)
Creatinine, Ser: 0.88 mg/dL (ref 0.76–1.27)
Glucose: 119 mg/dL — ABNORMAL HIGH (ref 70–99)
Potassium: 3.7 mmol/L (ref 3.5–5.2)
Sodium: 136 mmol/L (ref 134–144)
eGFR: 114 mL/min/{1.73_m2} (ref >59)

## 2021-10-09 NOTE — Progress Notes (Signed)
Internal Medicine Clinic Attending  Case discussed with Dr. Nguyen  At the time of the visit.  We reviewed the resident's history and exam and pertinent patient test results.  I agree with the assessment, diagnosis, and plan of care documented in the resident's note. 

## 2021-11-08 ENCOUNTER — Ambulatory Visit: Payer: 59 | Admitting: Student

## 2021-11-08 ENCOUNTER — Encounter: Payer: Self-pay | Admitting: Student

## 2021-11-08 VITALS — BP 134/88 | HR 91 | Temp 98.4°F | Wt 302.9 lb

## 2021-11-08 DIAGNOSIS — H6592 Unspecified nonsuppurative otitis media, left ear: Secondary | ICD-10-CM | POA: Diagnosis not present

## 2021-11-08 DIAGNOSIS — F17210 Nicotine dependence, cigarettes, uncomplicated: Secondary | ICD-10-CM | POA: Diagnosis not present

## 2021-11-08 DIAGNOSIS — I10 Essential (primary) hypertension: Secondary | ICD-10-CM | POA: Diagnosis not present

## 2021-11-08 DIAGNOSIS — Z9189 Other specified personal risk factors, not elsewhere classified: Secondary | ICD-10-CM | POA: Diagnosis not present

## 2021-11-08 MED ORDER — AMOXICILLIN-POT CLAVULANATE 875-125 MG PO TABS: 1.0000 | ORAL_TABLET | Freq: Two times a day (BID) | ORAL | 0 refills | Status: AC

## 2021-11-08 NOTE — Assessment & Plan Note (Signed)
A home sleep study order was placed but patient never received an appointment.  We have placed a referral to [facility] neurology for sleep study.

## 2021-11-08 NOTE — Assessment & Plan Note (Addendum)
Patient returns for 1 month follow-up on his muffled hearing.  States that his hearing issue is not improved.  In fact it has gotten slightly worsened.  Patient denies any fever, chills, ear pain, rhinorrhea, coughing, sore throat, sinus issue, or upper respiratory tract infection symptoms.  Physical exam showed worsening effusion of the bilateral tympanic membrane, left worse than right.  There was white sediment noted at the bottom.  No signs of perforation.  The ear canal is erythematous.  No discharge noted.  No pain of the external and internal ear structure with maneuvers.  Nose and mouth exam were normal.  Unclear etiology of his worsening tympanic membrane effusion.  This could be secondary to an infection even though patient has no pain or symptoms of an infection.  -Referral to ENT for further follow-up -Start 7-day course of Augmentin.  Patient has a penicillin allergy  when he was a child that he does not remember very well.  He thought that he might had a rash with penicillin.  Patient is willing to try Augmentin and will let us know if he experiences any allergic symptoms.

## 2021-11-08 NOTE — Assessment & Plan Note (Signed)
Initial blood pressure 148/105.  Repeat 134/88. Patient reports adherence to his amlodipine-olmesartan even though he missed 3 days of medication last week.  -Continue amlodipine-olmesartan 10-20 mg given improved blood pressure -Check BMP today

## 2021-11-08 NOTE — Progress Notes (Signed)
CC:  1 month f/u for blood pressure and hearing.  HPI:  JoelJoel Li is a 36 y.o. medical history of hypertension, tobacco use disorder who presents to the clinic for 1 month follow-up on his blood pressure.  Please see problem based charting for details  Past Medical History:  Diagnosis Date   Hypertension    Tobacco use disorder    Review of Systems:  per HPI  Physical Exam:  Vitals:   11/08/21 1324 11/08/21 1340  BP: (!) 148/105 134/88  Pulse: 98 91  Temp: 98.4 F (36.9 C)   TempSrc: Oral   SpO2: 99%   Weight: (!) 302 lb 14.4 oz (137.4 kg)    Physical Exam Constitutional:      General: He is not in acute distress.    Appearance: He is not ill-appearing.  HENT:     Head: Normocephalic.     Ears:     Comments: Worsening effusion of the bilateral tympanic membrane, left worse than right.  There was white sediment noted at the bottom.  The ear canal is erythematous.  No discharge noted.  No pain of the external and internal ear structure with maneuvers.  Nose and mouth exam were normal. Eyes:     General:        Right eye: No discharge.        Left eye: No discharge.     Conjunctiva/sclera: Conjunctivae normal.  Cardiovascular:     Rate and Rhythm: Normal rate and regular rhythm.  Pulmonary:     Effort: Pulmonary effort is normal. No respiratory distress.     Breath sounds: Normal breath sounds. No wheezing.  Musculoskeletal:        General: Normal range of motion.  Skin:    General: Skin is warm.  Neurological:     General: No focal deficit present.     Mental Status: He is alert.  Psychiatric:        Mood and Affect: Mood normal.      Assessment & Plan:   See Encounters Tab for problem based charting.  Hypertension Initial blood pressure 148/105.  Repeat 134/88. Patient reports adherence to his amlodipine-olmesartan even though he missed 3 days of medication last week.  -Continue amlodipine-olmesartan 10-20 mg given improved blood  pressure -Check BMP today  Fluid level behind tympanic membrane of left ear Patient returns for 1 month follow-up on his muffled hearing.  States that his hearing issue is not improved.  In fact it has gotten slightly worsened.  Patient denies any fever, chills, ear pain, rhinorrhea, coughing, sore throat, sinus issue, or upper respiratory tract infection symptoms.  Physical exam showed worsening effusion of the bilateral tympanic membrane, left worse than right.  There was white sediment noted at the bottom.  No signs of perforation.  The ear canal is erythematous.  No discharge noted.  No pain of the external and internal ear structure with maneuvers.  Nose and mouth exam were normal.  Unclear etiology of his worsening tympanic membrane effusion.  This could be secondary to an infection even though patient has no pain or symptoms of an infection.  -Referral to ENT for further follow-up -Start 7-day course of Augmentin.  Patient has a penicillin allergy  when he was a child that he does not remember very well.  He thought that he might had a rash with penicillin.  Patient is willing to try Augmentin and will let us know if he experiences any allergic symptoms.  At  risk for obstructive sleep apnea A home sleep study order was placed but patient never received an appointment.  We have placed a referral to [facility] neurology for sleep study.   Patient discussed with Dr. Mikey Bussing

## 2021-11-08 NOTE — Patient Instructions (Addendum)
Joel Li,  It was nice seeing you in the clinic today.  Here is a summary of what we talked about:  1.  Hearing issue: I will send you to an ENT doctor for further evaluation.  In the meantime I will start you on a 7-day course of Augmentin.  Please take 1 tablet 2 times a day.  2.  Your blood pressure has improved today.  I will check your kidney function.  Please continue the same dose of your amlodipine-olmesartan.  3.  I placed a new referral to [facility] neurology for sleep study.  Please return in 3 months, sooner if needed.  Take care  Dr. Cyndie Chime

## 2021-11-09 LAB — BMP8+ANION GAP
Anion Gap: 18 mmol/L (ref 10.0–18.0)
BUN/Creatinine Ratio: 17 (ref 9–20)
BUN: 14 mg/dL (ref 6–20)
CO2: 21 mmol/L (ref 20–29)
Calcium: 9.2 mg/dL (ref 8.7–10.2)
Chloride: 101 mmol/L (ref 96–106)
Creatinine, Ser: 0.83 mg/dL (ref 0.76–1.27)
Glucose: 96 mg/dL (ref 70–99)
Potassium: 3.8 mmol/L (ref 3.5–5.2)
Sodium: 140 mmol/L (ref 134–144)
eGFR: 116 mL/min/{1.73_m2} (ref >59)

## 2021-11-10 NOTE — Progress Notes (Signed)
Internal Medicine Clinic Attending  Case discussed with the resident at the time of the visit.  We reviewed the resident's history and exam and pertinent patient test results.  I agree with the assessment, diagnosis, and plan of care documented in the resident's note.  

## 2021-12-14 ENCOUNTER — Ambulatory Visit (INDEPENDENT_AMBULATORY_CARE_PROVIDER_SITE_OTHER): Payer: 59 | Admitting: Neurology

## 2021-12-14 ENCOUNTER — Encounter: Payer: Self-pay | Admitting: Neurology

## 2021-12-14 VITALS — BP 175/109 | HR 87 | Ht 69.0 in | Wt 298.8 lb

## 2021-12-14 DIAGNOSIS — H6593 Unspecified nonsuppurative otitis media, bilateral: Secondary | ICD-10-CM | POA: Diagnosis not present

## 2021-12-14 DIAGNOSIS — G4710 Hypersomnia, unspecified: Secondary | ICD-10-CM | POA: Diagnosis not present

## 2021-12-14 DIAGNOSIS — Z8709 Personal history of other diseases of the respiratory system: Secondary | ICD-10-CM

## 2021-12-14 DIAGNOSIS — R351 Nocturia: Secondary | ICD-10-CM | POA: Insufficient documentation

## 2021-12-14 DIAGNOSIS — G4719 Other hypersomnia: Secondary | ICD-10-CM | POA: Insufficient documentation

## 2021-12-14 DIAGNOSIS — I10 Essential (primary) hypertension: Secondary | ICD-10-CM | POA: Insufficient documentation

## 2021-12-14 DIAGNOSIS — G4730 Sleep apnea, unspecified: Secondary | ICD-10-CM

## 2021-12-14 DIAGNOSIS — Z6841 Body Mass Index (BMI) 40.0 and over, adult: Secondary | ICD-10-CM

## 2021-12-14 DIAGNOSIS — R519 Headache, unspecified: Secondary | ICD-10-CM | POA: Insufficient documentation

## 2021-12-14 HISTORY — DX: Other hypersomnia: G47.19

## 2021-12-14 HISTORY — DX: Hypersomnia, unspecified: G47.10

## 2021-12-14 HISTORY — DX: Nocturia: R35.1

## 2021-12-14 HISTORY — DX: Personal history of other diseases of the respiratory system: Z87.09

## 2021-12-14 MED ORDER — BUPROPION HCL ER (XL) 150 MG PO TB24: 150.0000 mg | ORAL_TABLET | Freq: Every day | ORAL | 1 refills | Status: DC

## 2021-12-14 NOTE — Progress Notes (Signed)
SLEEP MEDICINE CLINIC    Provider:  Melvyn Novas, MD  Primary Care Physician:  Doran Stabler, DO [address] [place] [address]     Referring Provider: Miguel Aschoff, Md [address] [place],  [place] [address]          Chief Complaint according to patient   Patient presents with:     New Patient (Initial Visit)     Joel Li. [name]       HISTORY OF PRESENT ILLNESS: Joel Li. Joel Li. [place]  is a 36 y.o. year old White or Caucasian male patient seen here as a referral on 12/14/2021 from Dr. Cyndie Chime for a sleep medicine consultation.   Chief concern according to patient :  " I have gained weight over the last 5 years,  loud snoring now- I feel when I stop breathing at night, my wife witnessed apneas as well. I still smoke, hearing loss was diagnosed as related to post-membranous tympanic fluid built up and I get tubes placed soon. I have pressure headaches in the morning , in my ears , behind the eyes, dry mouth in AM"     I have the pleasure of seeing Joel Li today, a right -handed White or Caucasian male with a possible sleep disorder.  He has a past medical history of Hypertension, Proteinuria- morbid obesity class 3, Tobacco use disorder, frequent NOCTURIA, 3 times or more, Migraines, sinus congestion.      Sleep relevant medical history: Nocturia , Tonsillectomy, deviated septum , ear pressure.     Family medical /sleep history: brother with OSA.  Social history:  Patient is working as Designer, industrial/product- frozen foods- and lives in a household with1 step daughter. The patient currently works in daytime. Tobacco use: 1/2 pack per day- .  ETOH use : weekends up to 6 drinks, Bourbon.   Caffeine intake in form of Coffee( 1 a day or less ) Soda( 1 or less a day) Tea ( /)  sometimes energy drinks ( red bull, monster) . Regular exercise in form of walking. swimming.   Hobbies : none       Sleep habits are as follows: The patient's dinner time is  between 6-7 PM. The patient goes to bed at 11-12 PM and continues to sleep for a total of 5-6  hours, wakes for 3 bathroom breaks, the first time at 2-3 AM.  The bedroom is cool quiet and dark. Flat mattress. He gets hot easily. No RLS>  The preferred sleep position is laterally, with the support of 4 pillows. I sleep better in a recliner "   Dreams are reportedly  frequent/vivid.  7  AM is the usual rise time. The patient wakes up with an alarm at 6 AM , hits the snooze button many times.  He reports not feeling refreshed or restored in AM, with symptoms such as dry mouth, morning headaches, and residual fatigue.  Naps are taken frequently,  whenever possible - 'i move around to not get drowsy" lasting from 60 to 90 minutes , he would need someone waking him up to keep a power nap- and are more refreshing than nocturnal sleep.    Review of Systems: Out of a complete 14 system review, the patient complains of only the following symptoms, and all other reviewed systems are negative.:  Fatigue, sleepiness , snoring, fragmented sleep, Insomnia -no restorative sleep- never feel rested , wake up at 3 AM and cough,  choke or gasp.   "I have gained weight over the last 5 years,  loud snoring now- I feel when I stop breathing at night, my wife witnessed apneas as well. I still smoke, hearing loss was diagnosed as related to post-membranous tympanic fluid built up and I get tubes placed soon. I have pressure headaches in the morning , in my ears , behind the eyes, dry mouth in AM"    How likely are you to doze in the following situations: 0 = not likely, 1 = slight chance, 2 = moderate chance, 3 = high chance   Sitting and Reading? Watching Television? Sitting inactive in a public place (theater or meeting)? As a passenger in a car for an hour without a break? Lying down in the afternoon when circumstances permit? Sitting and talking to someone? Sitting quietly after lunch without alcohol? In a car,  while stopped for a few minutes in traffic?   Total = 13/ 24 points   FSS endorsed at 46/ 63 points.   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  Tobacco Use   Smoking status: Every Day    Packs/day: 0.50    Years: 10.00    Total pack years: 5.00    Types: Cigarettes   Smokeless tobacco: Never   Tobacco comments:    =.5 pkpd  Vaping Use   Vaping Use: Never used  Substance and Sexual Activity   Alcohol use: Yes    Alcohol/week: 5.0 standard drinks of alcohol    Types: 5 Shots of liquor per week    Comment: weekly   Drug use: Not Currently    Types: Marijuana   Sexual activity: Yes  Other Topics Concern   Not on file  Social History Narrative   Not on file   Social Determinants of Health   Financial Resource Strain: Not on file  Food Insecurity: Not on file  Transportation Needs: Not on file  Physical Activity: Not on file  Stress: Not on file  Social Connections: Not on file    Family History  Problem Relation Age of Onset   Lung cancer Father    Hyperlipidemia Father    Cleft palate Brother     Past Medical History:  Diagnosis Date   Hypertension    Tobacco use disorder     History reviewed. No pertinent surgical history.   Li Outpatient Medications on File Prior to Visit  Medication Sig Dispense Refill   amlodipine-olmesartan (AZOR) 10-20 MG tablet Take 1 tablet by mouth daily. 90 tablet 3   No Li facility-administered medications on file prior to visit.    Allergies  Allergen Reactions   Penicillins Rash    Physical exam:  Today's Vitals   12/14/21 1110  BP: (!) 175/109  Pulse: 87  Weight: 298 lb 12.8 oz (135.5 kg)  Height: 5\' 9"  (1.753 m)   Body mass index is 44.13 kg/m.   Wt  Readings from Last 3 Encounters:  12/14/21 298 lb 12.8 oz (135.5 kg)  11/08/21 (!) 302 lb 14.4 oz (137.4 kg)  10/06/21 292 lb (132.5 kg)     Ht Readings from Last 3 Encounters:  12/14/21 5\' 9"  (1.753 m)  10/06/21 5\' 9"  (  1.753 m)  03/02/21 5\' 9"  (1.753 m)      General: The patient is awake, alert and appears not in acute distress. The patient is well groomed. Head: Normocephalic, atraumatic. Neck is supple. Mallampati  3-,  neck circumference:18.5 inches . Nasal airflow restricted- Retrognathia is not  seen.  Dental status: biological Cardiovascular:  Regular rate and cardiac rhythm by pulse,  without distended neck veins. Respiratory: Lungs are clear to auscultation.  Skin:  With evidence of ankle edema. Trunk: The patient's posture is erect.   Neurologic exam : The patient is awake and alert, oriented to place and time.   Memory subjective described as intact.  Attention span & concentration ability appears normal.  Speech is fluent,  without dysarthria, dysphonia or aphasia.  Mood and affect are appropriate.   Cranial nerves: no loss of smell or taste reported  Pupils are equal and briskly reactive to light. Funduscopic exam deferred. .  Extraocular movements in vertical and horizontal planes were intact and without nystagmus. No Diplopia. Visual fields by finger perimetry are intact. Hearing was intact to tuning fork air conduction but louder in the right to bone conduction.   Facial sensation intact to fine touch.  Facial motor strength is symmetric and tongue and uvula move midline.  Neck ROM : rotation, tilt and flexion extension were normal for age and shoulder shrug was symmetrical.    Motor exam:  Symmetric bulk, tone and ROM.   Normal tone without cog wheeling, symmetric grip strength .   Sensory:  Fine touch, pinprick and vibration were tested  and  normal.  Proprioception tested in the upper extremities was normal.   Coordination: Rapid alternating movements in  the fingers/hands were of normal speed.  The Finger-to-nose maneuver was intact without evidence of ataxia, dysmetria or tremor.   Gait and station: Patient could rise unassisted from a seated position, walked without assistive device.  Stance is of wider base and the patient turned with 3 steps.  Toe and heel walk were deferred.  Deep tendon reflexes: in the  upper and lower extremities are symmetric and intact.  Babinski response was deferred .       After spending a total time of  45  minutes face to face and additional time for physical and neurologic examination, review of laboratory studies,  personal review of imaging studies, reports and results of other testing and review of referral information / records as far as provided in visit, I have established the following assessments:  1) I have the pleasure of meeting Mr. . Lac today a 36 year old married Caucasian gentleman who has had some weight gain over the last 5 years.  He has been told that he snores in his sleep and his wife has noticed him to catch his breath to gasp or stop breathing sometimes he wakes up by himself from the symptoms.  He also goes to the bathroom at night to void frequently 3 times or more.  He has had tympanic fluid buildup and pressure headaches often behind the eyes, in both ears, and sometimes a dull headache in the morning when he wakes up.  He is known to snore.  His risk factors for obstructive sleep apnea are his BMI, 44 a slightly higher grade airway obstruction of a Mallampati 3 with an status post tonsillectomy.  He does often have a congested nasal passage and season for allergies.  Sometimes he wakes up this puffy feet.  Additional risk factors are his smoking  habit.  And additional indicators are related to his often very high blood pressure today 175/110 mmHg in his left arm at rest.  He is not tachypneic and his pulse rate was 87 and regular.  Obstructive sleep apnea can lead to higher  blood pressures, 2 irregular heartbeats, bradycardia at night, it increases the risk of atrial fibrillation and embolic strokes.   Prediabetes can be related to lack of slow-wave sleep, which is the restorative sleep stage in which insulin is cleaved.     My Plan is to proceed with: My goal is it for Mr. Joel Li.[place]urn to undergo sleep apnea screening and testing by home sleep test or SPLIT PSG -and to initiate treatment thereafter. 2) smoking cessation- wellbutrin 150 mg may help,only in AM ! 3)  diet and exercise and consider OZEMPIC>    I would like to thank Doran Stabler, DO and Miguel Aschoff, Md [address] [place],  [place] [address] for allowing me to meet with and to take care of this pleasant patient.   In short, Rodd Heft is presenting with uncontrolled HTN,  morbid obesity, witnessed apnea, choking, Nocturia and non restorative sleep with morning headaches. I plan to follow up either personally or through our NP within 2-4 months.   CC: I will share my notes with PCP.  Electronically signed by: Melvyn Novas, MD 12/14/2021 11:22 AM  [facility] Board certified by The [employer] of Sleep Medicine and Diplomate of the [employer] of [employer] Medicine. Board certified In Neurology through the ABPN, Fellow of the [employer] of Neu[employer]. Medical Director of [facility].

## 2021-12-14 NOTE — Patient Instructions (Addendum)
Steps to Quit Smoking    Bupropion Tablets (Depression/Mood Disorders/ Smoking cessation) What is this medication? BUPROPION (byoo PROE pee on) treats depression and changes appetite for food and tobacco. It increases norepinephrine and dopamine in the brain, hormones that help regulate mood. It belongs to a group of medications called NDRIs. This medicine may be used for other purposes; ask your health care provider or pharmacist if you have questions. COMMON BRAND NAME(S): Wellbutrin What should I tell my care team before I take this medication? They need to know if you have any of these conditions: An eating disorder, such as anorexia or bulimia Bipolar disorder or psychosis Diabetes or high blood sugar, treated with medication Glaucoma Heart disease, previous heart attack, or irregular heart beat Head injury or brain tumor High blood pressure Kidney or liver disease Seizures Suicidal thoughts or a previous suicide attempt Tourette's syndrome Weight loss An unusual or allergic reaction to bupropion, other medications, foods, dyes, or preservatives Pregnant or trying to become pregnant Breast-feeding How should I use this medication? Take this medication by mouth with a glass of water. Follow the directions on the prescription label. You can take it with or without food. If it upsets your stomach, take it with food. Take your medication at regular intervals. Do not take your medication more often than directed. Do not stop taking this medication suddenly except upon the advice of your care team. Stopping this medication too quickly may cause serious side effects or your condition may worsen. A special MedGuide will be given to you by the pharmacist with each prescription and refill. Be sure to read this information carefully each time. Talk to your care team regarding the use of this medication in children. Special care may be needed. Overdosage: If you think you have taken too much  of this medicine contact a poison control center or emergency room at once. NOTE: This medicine is only for you. Do not share this medicine with others. What if I miss a dose? If you miss a dose, take it as soon as you can. If it is less than four hours to your next dose, take only that dose and skip the missed dose. Do not take double or extra doses. What may interact with this medication? Do not take this medication with any of the following: Linezolid MAOIs like Azilect, Carbex, Eldepryl, Marplan, Nardil, and Parnate Methylene blue (injected into a vein) Other medications that contain bupropion like Zyban This medication may also interact with the following: Alcohol Certain medications for anxiety or sleep Certain medications for blood pressure like metoprolol, propranolol Certain medications for depression or psychotic disturbances Certain medications for HIV or AIDS like efavirenz, lopinavir, nelfinavir, ritonavir Certain medications for irregular heart beat like propafenone, flecainide Certain medications for Parkinson's disease like amantadine, levodopa Certain medications for seizures like carbamazepine, phenytoin, phenobarbital Cimetidine Clopidogrel Cyclophosphamide Digoxin Furazolidone Isoniazid Nicotine Orphenadrine Procarbazine Steroid medications like prednisone or cortisone Stimulant medications for attention disorders, weight loss, or to stay awake Tamoxifen Theophylline Thiotepa Ticlopidine Tramadol Warfarin This list may not describe all possible interactions. Give your health care provider a list of all the medicines, herbs, non-prescription drugs, or dietary supplements you use. Also tell them if you smoke, drink alcohol, or use illegal drugs. Some items may interact with your medicine. What should I watch for while using this medication? Tell your care team if your symptoms do not get better or if they get worse. Visit your care team for regular checks on  your progress. Because it may take several weeks to see the full effects of this medication, it is important to continue your treatment as prescribed. Watch for new or worsening thoughts of suicide or depression. This includes sudden changes in mood, behavior, or thoughts. These changes can happen at any time but are more common in the beginning of treatment or after a change in dose. Call your care team right away if you experience these thoughts or worsening depression. Manic episodes may happen in patients with bipolar disorder who take this medication. Watch for changes in feelings or behaviors such as feeling anxious, nervous, agitated, panicky, irritable, hostile, aggressive, impulsive, severely restless, overly excited and hyperactive, or trouble sleeping. These symptoms can happen at anytime but are more common in the beginning of treatment or after a change in dose. Call your care team right away if you notice any of these symptoms. This medication may cause serious skin reactions. They can happen weeks to months after starting the medication. Contact your care team right away if you notice fevers or flu-like symptoms with a rash. The rash may be red or purple and then turn into blisters or peeling of the skin. Or, you might notice a red rash with swelling of the face, lips or lymph nodes in your neck or under your arms. Avoid drinks that contain alcohol while taking this medication. Drinking large amounts of alcohol, using sleeping or anxiety medications, or quickly stopping the use of these agents while taking this medication may increase your risk for a seizure. Do not drive or use heavy machinery until you know how this medication affects you. This medication can impair your ability to perform these tasks. Do not take this medication close to bedtime. It may prevent you from sleeping. Your mouth may get dry. Chewing sugarless gum or sucking hard candy, and drinking plenty of water may help.  Contact your care team if the problem does not go away or is severe. What side effects may I notice from receiving this medication? Side effects that you should report to your care team as soon as possible: Allergic reactions--skin rash, itching, hives, swelling of the face, lips, tongue, or throat Increase in blood pressure Mood and behavior changes--anxiety, nervousness, confusion, hallucinations, irritability, hostility, thoughts of suicide or self-harm, worsening mood, feelings of depression Redness, blistering, peeling, or loosening of the skin, including inside the mouth Seizures Sudden eye pain or change in vision such as blurry vision, seeing halos around lights, vision loss Side effects that usually do not require medical attention (report to your care team if they continue or are bothersome): Constipation Dizziness Dry mouth Loss of appetite Nausea Tremors or shaking Trouble sleeping This list may not describe all possible side effects. Call your doctor for medical advice about side effects. You may report side effects to FDA at 1-800-FDA-1088. Where should I keep my medication? Keep out of the reach of children and pets. Store at room temperature between 20 and 25 degrees C (68 and 77 degrees F), away from direct sunlight and moisture. Keep tightly closed. Throw away any unused medication after the expiration date. NOTE: This sheet is a summary. It may not cover all possible information. If you have questions about this medicine, talk to your doctor, pharmacist, or health care provider.  2023 Elsevier/Gold Standard (2007-05-10 00:00:00)  Smoking tobacco is the leading cause of preventable death. It can affect almost every organ in the body. Smoking puts you and people around you at risk for  many serious, long-lasting (chronic) diseases. Quitting smoking can be hard, but it is one of the best things that you can do for your health. It is never too late to quit. Do not give up if  you cannot quit the first time. Some people need to try many times to quit. Do your best to stick to your quit plan, and talk with your doctor if you have any questions or concerns. How do I get ready to quit? Pick a date to quit. Set a date within the next 2 weeks to give you time to prepare. Write down the reasons why you are quitting. Keep this list in places where you will see it often. Tell your family, friends, and co-workers that you are quitting. Their support is important. Talk with your doctor about the choices that may help you quit. Find out if your health insurance will pay for these treatments. Know the people, places, things, and activities that make you want to smoke (triggers). Avoid them. What first steps can I take to quit smoking? Throw away all cigarettes at home, at work, and in your car. Throw away the things that you use when you smoke, such as ashtrays and lighters. Clean your car. Empty the ashtray. Clean your home, including curtains and carpets. What can I do to help me quit smoking? Talk with your doctor about taking medicines and seeing a counselor. You are more likely to succeed when you do both. If you are pregnant or breastfeeding: Talk with your doctor about counseling or other ways to quit smoking. Do not take medicine to help you quit smoking unless your doctor tells you to. Quit right away Quit smoking completely, instead of slowly cutting back on how much you smoke over a period of time. Stopping smoking right away may be more successful than slowly quitting. Go to counseling. In-person is best if this is an option. You are more likely to quit if you go to counseling sessions regularly. Take medicine You may take medicines to help you quit. Some medicines need a prescription, and some you can buy over-the-counter. Some medicines may contain a drug called nicotine to replace the nicotine in cigarettes. Medicines may: Help you stop having the desire to smoke  (cravings). Help to stop the problems that come when you stop smoking (withdrawal symptoms). Your doctor may ask you to use: Nicotine patches, gum, or lozenges. Nicotine inhalers or sprays. Non-nicotine medicine that you take by mouth. Find resources Find resources and other ways to help you quit smoking and remain smoke-free after you quit. They include: Online chats with a Veterinary surgeon. Phone quitlines. Printed Materials engineer. Support groups or group counseling. Text messaging programs. Mobile phone apps. Use apps on your mobile phone or tablet that can help you stick to your quit plan. Examples of free services include Quit Guide from the CDC and smokefree.gov  What can I do to make it easier to quit?  Talk to your family and friends. Ask them to support and encourage you. Call a phone quitline, such as 1-800-QUIT-NOW, reach out to support groups, or work with a Veterinary surgeon. Ask people who smoke to not smoke around you. Avoid places that make you want to smoke, such as: Bars. Parties. Smoke-break areas at work. Spend time with people who do not smoke. Lower the stress in your life. Stress can make you want to smoke. Try these things to lower stress: Getting regular exercise. Doing deep-breathing exercises. Doing yoga. Meditating. What benefits will I  see if I quit smoking? Over time, you may have: A better sense of smell and taste. Less coughing and sore throat. A slower heart rate. Lower blood pressure. Clearer skin. Better breathing. Fewer sick days. Summary Quitting smoking can be hard, but it is one of the best things that you can do for your health. Do not give up if you cannot quit the first time. Some people need to try many times to quit. When you decide to quit smoking, make a plan to help you succeed. Quit smoking right away, not slowly over a period of time. When you start quitting, get help and support to keep you smoke-free. This information is not  intended to replace advice given to you by your health care provider. Make sure you discuss any questions you have with your health care provider. Document Revised: 03/10/2021 Document Reviewed: 03/10/2021 Elsevier Patient Education  2023 Elsevier Inc. Quality Sleep Information, Adult Quality sleep is important for your mental and physical health. It also improves your quality of life. Quality sleep means you: Are asleep for most of the time you are in bed. Fall asleep within 30 minutes. Wake up no more than once a night. Are awake for no longer than 20 minutes if you do wake up during the night. Most adults need 7-8 hours of quality sleep each night. How can poor sleep affect me? If you do not get enough quality sleep, you may have: Mood swings. Daytime sleepiness. Decreased alertness, reaction time, and concentration. Sleep disorders, such as insomnia and sleep apnea. Difficulty with: Solving problems. Coping with stress. Paying attention. These issues may affect your performance and productivity at work, school, and home. Lack of sleep may also put you at higher risk for accidents, suicide, and risky behaviors. If you do not get quality sleep, you may also be at higher risk for several health problems, including: Infections. Type 2 diabetes. Heart disease. High blood pressure. Obesity. Worsening of long-term conditions, like arthritis, kidney disease, depression, Parkinson's disease, and epilepsy. What actions can I take to get more quality sleep? Sleep schedule and routine Stick to a sleep schedule. Go to sleep and wake up at about the same time each day. Do not try to sleep less on weekdays and make up for lost sleep on weekends. This does not work. Limit naps during the day to 30 minutes or less. Do not take naps in the late afternoon. Make time to relax before bed. Reading, listening to music, or taking a hot bath promotes quality sleep. Make your bedroom a place that  promotes quality sleep. Keep your bedroom dark, quiet, and at a comfortable room temperature. Make sure your bed is comfortable. Avoid using electronic devices that give off bright blue light for 30 minutes before bedtime. Your brain perceives bright blue light as sunlight. This includes television, phones, and computers. If you are lying awake in bed for longer than 20 minutes, get up and do a relaxing activity until you feel sleepy. Lifestyle     Try to get at least 30 minutes of exercise on most days. Do not exercise 2-3 hours before going to bed. Do not use any products that contain nicotine or tobacco. These products include cigarettes, chewing tobacco, and vaping devices, such as e-cigarettes. If you need help quitting, ask your health care provider. Do not drink caffeinated beverages for at least 8 hours before going to bed. Coffee, tea, and some sodas contain caffeine. Do not drink alcohol or eat large meals  close to bedtime. Try to get at least 30 minutes of sunlight every day. Morning sunlight is best. Medical concerns Work with your health care provider to treat medical conditions that may affect sleeping, such as: Nasal obstruction. Snoring. Sleep apnea and other sleep disorders. Talk to your health care provider if you think any of your prescription medicines may cause you to have difficulty falling or staying asleep. If you have sleep problems, talk with a sleep consultant. If you think you have a sleep disorder, talk with your health care provider about getting evaluated by a specialist. Where to find more information Sleep Foundation: sleepfoundation.org American Academy of Sleep Medicine: aasm.org Centers for Disease Control and Prevention (CDC): TonerPromos.no Contact a health care provider if: You have trouble getting to sleep or staying asleep. You often wake up very early in the morning and cannot get back to sleep. You have daytime sleepiness. You have daytime sleep attacks  of suddenly falling asleep and sudden muscle weakness (narcolepsy). You have a tingling sensation in your legs with a strong urge to move your legs (restless legs syndrome). You stop breathing briefly during sleep (sleep apnea). You think you have a sleep disorder or are taking a medicine that is affecting your quality of sleep. Summary Most adults need 7-8 hours of quality sleep each night. Getting enough quality sleep is important for your mental and physical health. Make your bedroom a place that promotes quality sleep, and avoid things that may cause you to have poor sleep, such as alcohol, caffeine, smoking, or large meals. Talk to your health care provider if you have trouble falling asleep or staying asleep. This information is not intended to replace advice given to you by your health care provider. Make sure you discuss any questions you have with your health care provider. Document Revised: 07/12/2021 Document Reviewed: 07/12/2021 Elsevier Patient Education  2023 [employer].

## 2022-01-17 ENCOUNTER — Telehealth: Payer: Self-pay | Admitting: Neurology

## 2022-01-17 NOTE — Telephone Encounter (Signed)
[employer] pending uploaded notes on the portal  

## 2022-01-22 NOTE — Telephone Encounter (Signed)
Checked status it is still pending.  

## 2022-01-25 NOTE — Telephone Encounter (Signed)
Checked status it is still pending.  

## 2022-01-29 NOTE — Telephone Encounter (Signed)
Checked the status it is still pending  

## 2022-02-07 ENCOUNTER — Ambulatory Visit: Payer: 59 | Admitting: Neurology

## 2022-02-07 DIAGNOSIS — G4731 Primary central sleep apnea: Secondary | ICD-10-CM

## 2022-02-07 DIAGNOSIS — R519 Headache, unspecified: Secondary | ICD-10-CM

## 2022-02-07 DIAGNOSIS — G4733 Obstructive sleep apnea (adult) (pediatric): Secondary | ICD-10-CM | POA: Diagnosis not present

## 2022-02-07 DIAGNOSIS — R351 Nocturia: Secondary | ICD-10-CM

## 2022-02-07 DIAGNOSIS — I10 Essential (primary) hypertension: Secondary | ICD-10-CM

## 2022-02-07 DIAGNOSIS — Z8709 Personal history of other diseases of the respiratory system: Secondary | ICD-10-CM

## 2022-02-07 DIAGNOSIS — H6593 Unspecified nonsuppurative otitis media, bilateral: Secondary | ICD-10-CM

## 2022-02-07 DIAGNOSIS — G4734 Idiopathic sleep related nonobstructive alveolar hypoventilation: Secondary | ICD-10-CM

## 2022-02-07 NOTE — Telephone Encounter (Signed)
[employer] denied the Split  HST- [employer] no auth req patient is scheduled at mail out for 02/07/22.

## 2022-03-06 NOTE — Telephone Encounter (Signed)
Just spoke with patient about receiving our mail out home sleep test device. Pt confirmed that he did receive it and while be using it tonight 03/06/22.

## 2022-03-12 NOTE — Progress Notes (Signed)
[facility]  HOME SLEEP TEST (Watch PAT) REPORT  STUDY DATE: 03/06/2022  DOB: 05-03-1985  MRN: [id number]  ORDERING CLINICIAN: Huston Foley, MD, PhD - study read on behalf of Dr. Vickey Huger   REFERRING CLINICIAN: Doran Stabler, DO (PCP), Dr. Vickey Huger (sleep)  CLINICAL INFORMATION/HISTORY: 36 year old male with an underlying medical history of hypertension, eustachian tube dysfunction, and severe obesity with a BMI of over 40, who reports snoring and witnessed apneas.  Epworth sleepiness score: 13/24.  BMI: 44.4 kg/m  FINDINGS:   Sleep Summary:   Total Recording Time (hours, min): 7 hours, 29 min  Total Sleep Time (hours, min):  6 hours, 14 min  Percent REM (%):    19.5%   Respiratory Indices:   Calculated pAHI (per hour):  64.6/hour         REM pAHI:    90.4/hour       NREM pAHI: 58.4/hour  Central pAHI: 24.4/hour  Oxygen Saturation Statistics:    Oxygen Saturation (%) Mean: 93%   Minimum oxygen saturation (%):                 78%   O2 Saturation Range (%): 78-100%    O2 Saturation (minutes) <=88%: 33.3 min  Pulse Rate Statistics:   Pulse Mean (bpm):    85/min    Pulse Range (60-111/min)   IMPRESSION: OSA (obstructive sleep apnea), severe Central Sleep Apnea  Nocturnal Hypoxemia  RECOMMENDATION:   This home sleep test demonstrates severe obstructive sleep apnea with a total AHI of 64.6/hour and O2 nadir of 78%.  Snoring was detected, in the moderate to loud range fairly consistently throughout the study.  Treatment with positive airway pressure is highly recommended. This will require - ideally - a full night CPAP titration study for proper treatment settings, O2 monitoring and mask fitting. For now, the patient will be advised to proceed with an autoPAP titration/trial at home. A laboratory attended titration study can be considered in the future for optimization of treatment settings and to improve tolerance and compliance. Alternative  treatment options are limited secondary to the severity of the patient's sleep disordered breathing, but may include surgical treatment with an implantable hypoglossal nerve stimulator (in carefully selected candidates, meeting criteria).  Concomitant weight loss is recommended, where clinically appropriate. Please note, that untreated obstructive sleep apnea may carry additional perioperative morbidity. Patients with significant obstructive sleep apnea should receive perioperative PAP therapy and the surgeons and particularly the anesthesiologist should be informed of the diagnosis and the severity of the sleep disordered breathing. The patient should be cautioned not to drive, work at heights, or operate dangerous or heavy equipment when tired or sleepy. Review and reiteration of good sleep hygiene measures should be pursued with any patient. Other causes of the patient's symptoms, including circadian rhythm disturbances, an underlying mood disorder, medication effect and/or an underlying medical problem cannot be ruled out based on this test. Clinical correlation is recommended.  The patient and his referring provider will be notified of the test results. The patient will be seen in follow up in sleep clinic at [facility].  I certify that I have reviewed the raw data recording prior to the issuance of this report in accordance with the standards of the American Academy of Sleep Medicine (AASM).    INTERPRETING PHYSICIAN:   Huston Foley, MD, PhD Medical Director, [facility]) Diplomat, ABPN (Neurology and Sleep)   [facility] [address] [place], [place]  [address] [phone]                    

## 2022-03-14 ENCOUNTER — Telehealth: Payer: Self-pay | Admitting: Neurology

## 2022-03-14 DIAGNOSIS — G4731 Primary central sleep apnea: Secondary | ICD-10-CM

## 2022-03-14 DIAGNOSIS — G4733 Obstructive sleep apnea (adult) (pediatric): Secondary | ICD-10-CM

## 2022-03-14 DIAGNOSIS — G4734 Idiopathic sleep related nonobstructive alveolar hypoventilation: Secondary | ICD-10-CM

## 2022-03-14 NOTE — Procedures (Signed)
[facility]  HOME SLEEP TEST (Watch PAT) REPORT  STUDY DATE: 03/06/2022  DOB: [birth year]  MRN: [id number]  ORDERING CLINICIAN: Huston Foley, MD, PhD - study read on behalf of Dr. Vickey Huger   REFERRING CLINICIAN: Doran Stabler, DO (PCP), Dr. Vickey Huger (Sleep)  CLINICAL INFORMATION/HISTORY: 36 year old male with an underlying medical history of hypertension, eustachian tube dysfunction, and severe obesity with a BMI of over 40, who reports snoring and witnessed apneas.  Epworth sleepiness score: 13/24.  BMI: 44.4 kg/m  FINDINGS:   Sleep Summary:   Total Recording Time (hours, min): 7 hours, 29 min  Total Sleep Time (hours, min):  6 hours, 14 min  Percent REM (%):    19.5%   Respiratory Indices:   Calculated pAHI (per hour):  64.6/hour         REM pAHI:    90.4/hour       NREM pAHI: 58.4/hour  Central pAHI: 24.4/hour  Oxygen Saturation Statistics:    Oxygen Saturation (%) Mean: 93%   Minimum oxygen saturation (%):                 78%   O2 Saturation Range (%): 78-100%    O2 Saturation (minutes) <=88%: 33.3 min  Pulse Rate Statistics:   Pulse Mean (bpm):    85/min    Pulse Range (60-111/min)   IMPRESSION: OSA (obstructive sleep apnea), severe Central Sleep Apnea  Nocturnal Hypoxemia  RECOMMENDATION:   This home sleep test demonstrates severe obstructive sleep apnea with a total AHI of 64.6/hour and O2 nadir of 78%, with significant time below or at 88% saturation of over 30 minutes for the night, indicating nocturnal hypoxemia. Snoring was detected, in the moderate to loud range fairly consistently throughout the study.  Treatment with positive airway pressure is highly recommended. This will require - ideally - a full night CPAP titration study for proper treatment settings, O2 monitoring and mask fitting. For now, the patient will be advised to proceed with an autoPAP titration/trial at home. A laboratory attended titration study can be  considered in the future for optimization of treatment settings and to improve tolerance and compliance. Alternative treatment options are limited secondary to the severity of the patient's sleep disordered breathing, but may include surgical treatment with an implantable hypoglossal nerve stimulator (in carefully selected candidates, meeting criteria).  Concomitant weight loss is recommended, where clinically appropriate. Please note, that untreated obstructive sleep apnea may carry additional perioperative morbidity. Patients with significant obstructive sleep apnea should receive perioperative PAP therapy and the surgeons and particularly the anesthesiologist should be informed of the diagnosis and the severity of the sleep disordered breathing. The patient should be cautioned not to drive, work at heights, or operate dangerous or heavy equipment when tired or sleepy. Review and reiteration of good sleep hygiene measures should be pursued with any patient. Other causes of the patient's symptoms, including circadian rhythm disturbances, an underlying mood disorder, medication effect and/or an underlying medical problem cannot be ruled out based on this test. Clinical correlation is recommended.  The patient and his referring provider will be notified of the test results. The patient will be seen in follow up in sleep clinic at [facility].  I certify that I have reviewed the raw data recording prior to the issuance of this report in accordance with the standards of the American Academy of Sleep Medicine (AASM).    INTERPRETING PHYSICIAN:   Huston Foley, MD, PhD Medical Director, [facility])  Diplomat, ABPN (Neurology and Sleep)   [facility] [address] [place], [place] [address] [phone]

## 2022-03-14 NOTE — Telephone Encounter (Addendum)
Called pt [phone].  I advised pt that Dr. Frances Furbish reviewed their sleep study results since Dr. Vickey Huger out of office and found that pt has sleep apnea. Dr. Frances Furbish recommends that pt start CPAP. I reviewed PAP compliance expectations with the pt. Pt is agreeable to starting a CPAP. I advised pt that an order will be sent to a DME, [employer], and [employer] will call the pt within about one week after they file with the pt's insurance. [employer] will show the pt how to use the machine, fit for masks, and troubleshoot the CPAP if needed. Provided pt [employer] phone# [phone] in case they do not reach out in the next week and he can call. A follow up appt was made for insurance purposes with Dr. Vickey Huger 06/13/22 at 8:30am. Pt verbalized understanding to arrive 15 minutes early and bring their CPAP. Pt verbalized understanding of results. Pt had no questions at this time but was encouraged to call back if questions arise. I have sent the order to [employer] and have received confirmation that they have received the order.

## 2022-03-14 NOTE — Telephone Encounter (Signed)
This patient saw Dr. Vickey Huger for sleep evaluation on 12/14/2021.  I read the home sleep test from 03/06/2022 on Dr. Oliva Bustard behalf:   Please call and notify the patient that the recent home sleep test showed obstructive sleep apnea in the severe range. I recommend treatment for this in the form of autoPAP, which means, that we don't have to bring him in for a sleep study with CPAP, but will let him start using a so called autoPAP machine at home, through a DME company (of his choice, or as per insurance requirement). The DME representative will fit the patient with a mask of choice, educate him on how to use the machine, how to put the mask on, etc. I have placed an order in the chart. Please send the order to a local DME, talk to patient, send report to referring MD. Please also reinforce the need for compliance with treatment. We will need a FU in sleep clinic for 10 weeks post-PAP set up, please arrange appt with Dr. Algis Downs. or one of our NPs. Thanks,   Huston Foley, MD, PhD [facility])

## 2022-05-31 ENCOUNTER — Telehealth: Payer: Self-pay | Admitting: Neurology

## 2022-05-31 NOTE — Telephone Encounter (Signed)
See other phone note

## 2022-05-31 NOTE — Telephone Encounter (Signed)
Pt states about a week ago he called  DME: [facility]  and was told he does not show in their system, he said he was told the order must not have been sent over, please call.

## 2022-05-31 NOTE — Telephone Encounter (Signed)
Pt called on 05/31/2022- "Pt states about a week ago he called  DME: [facility]  and was told he does not show in their system, he said he was told the order must not have been sent over, please call."  Will send a message to [employer] [facility].

## 2022-06-13 ENCOUNTER — Ambulatory Visit: Payer: Self-pay | Admitting: Neurology

## 2022-07-26 IMAGING — CR DG CHEST 2V
2 series · 2 of 2 positions shown · non-contrast
Comparison: Chest x-ray 12/30/2020.

CLINICAL DATA: 35-year-old male with history of chest pain.

EXAM:
CHEST - 2 VIEW

[w chest pa]
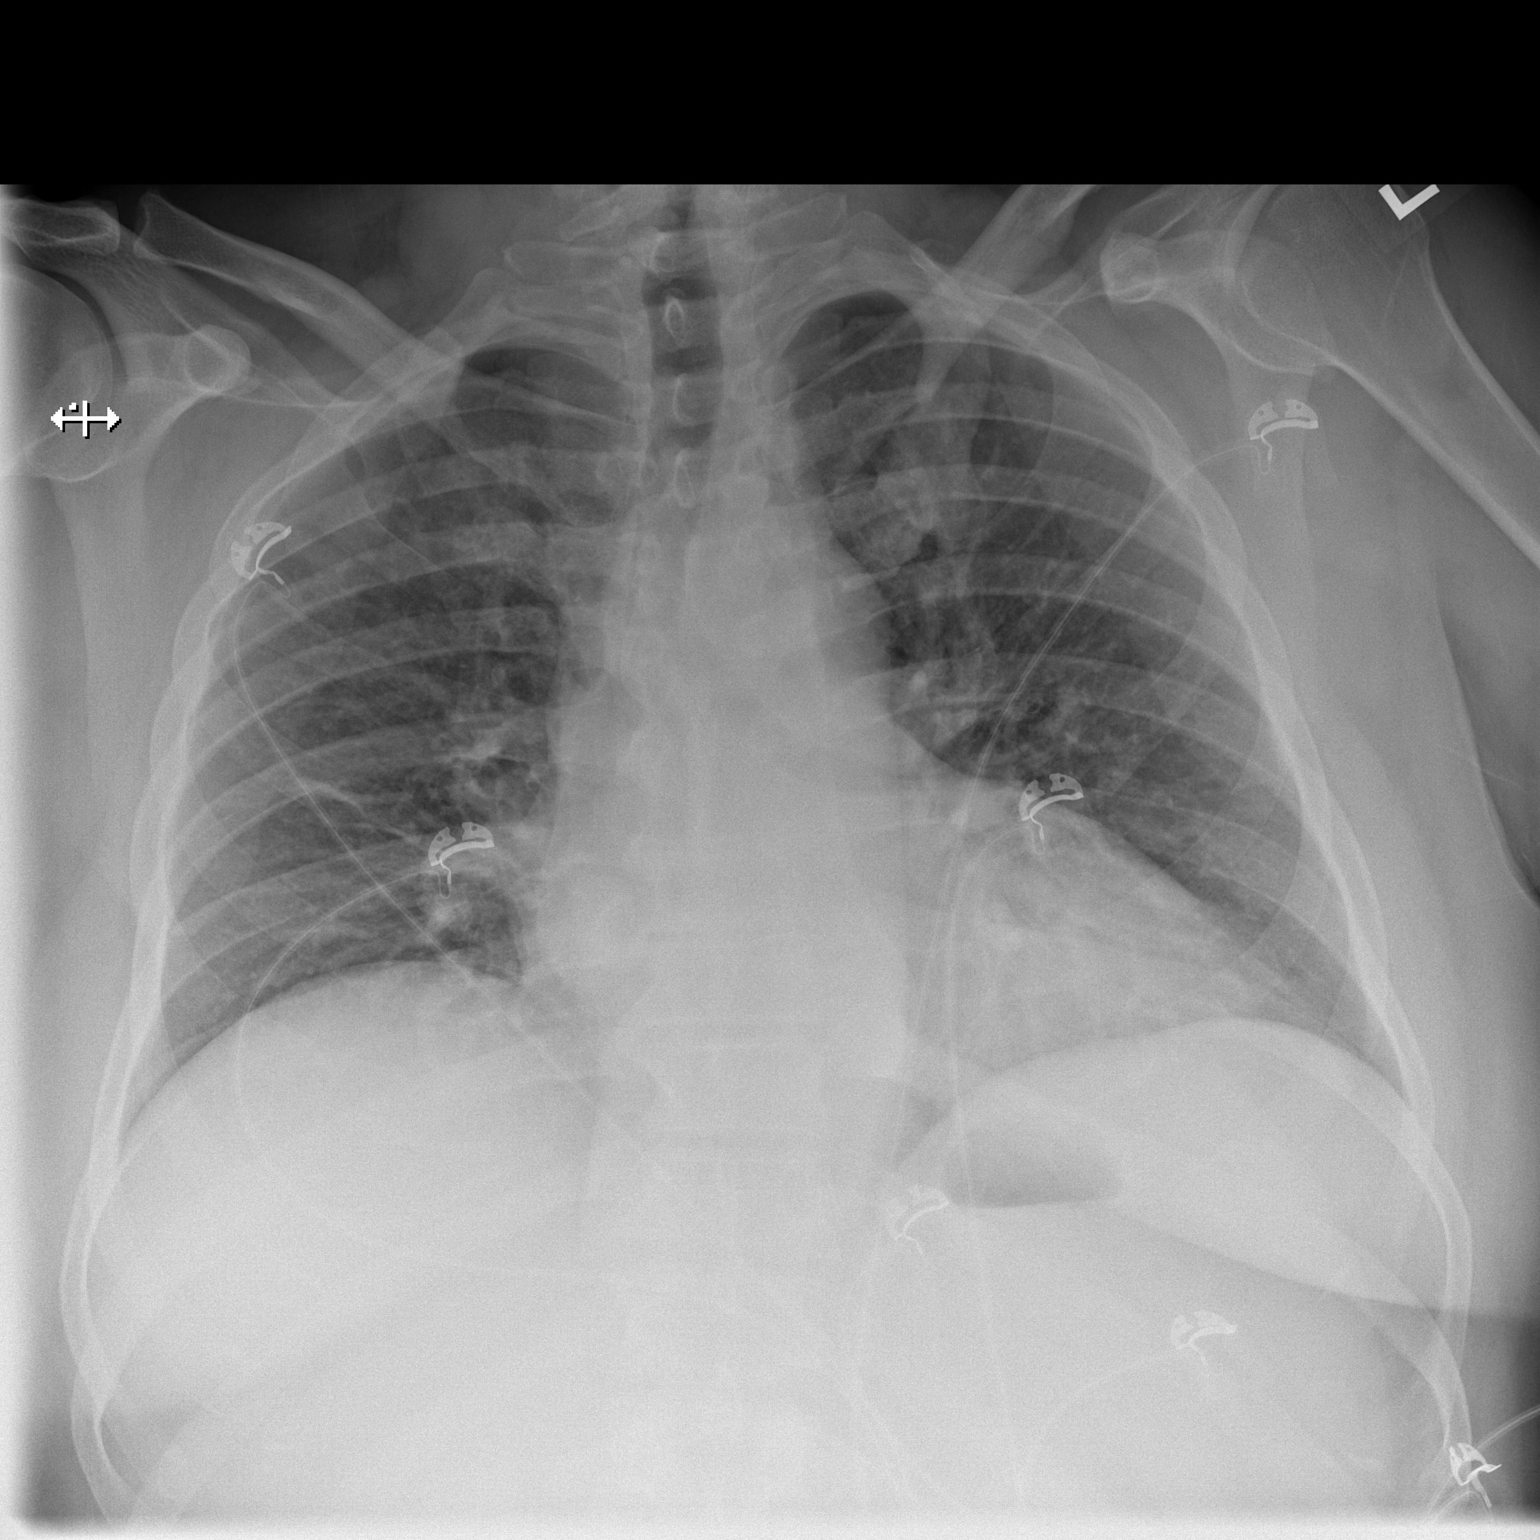

[w chest lat]
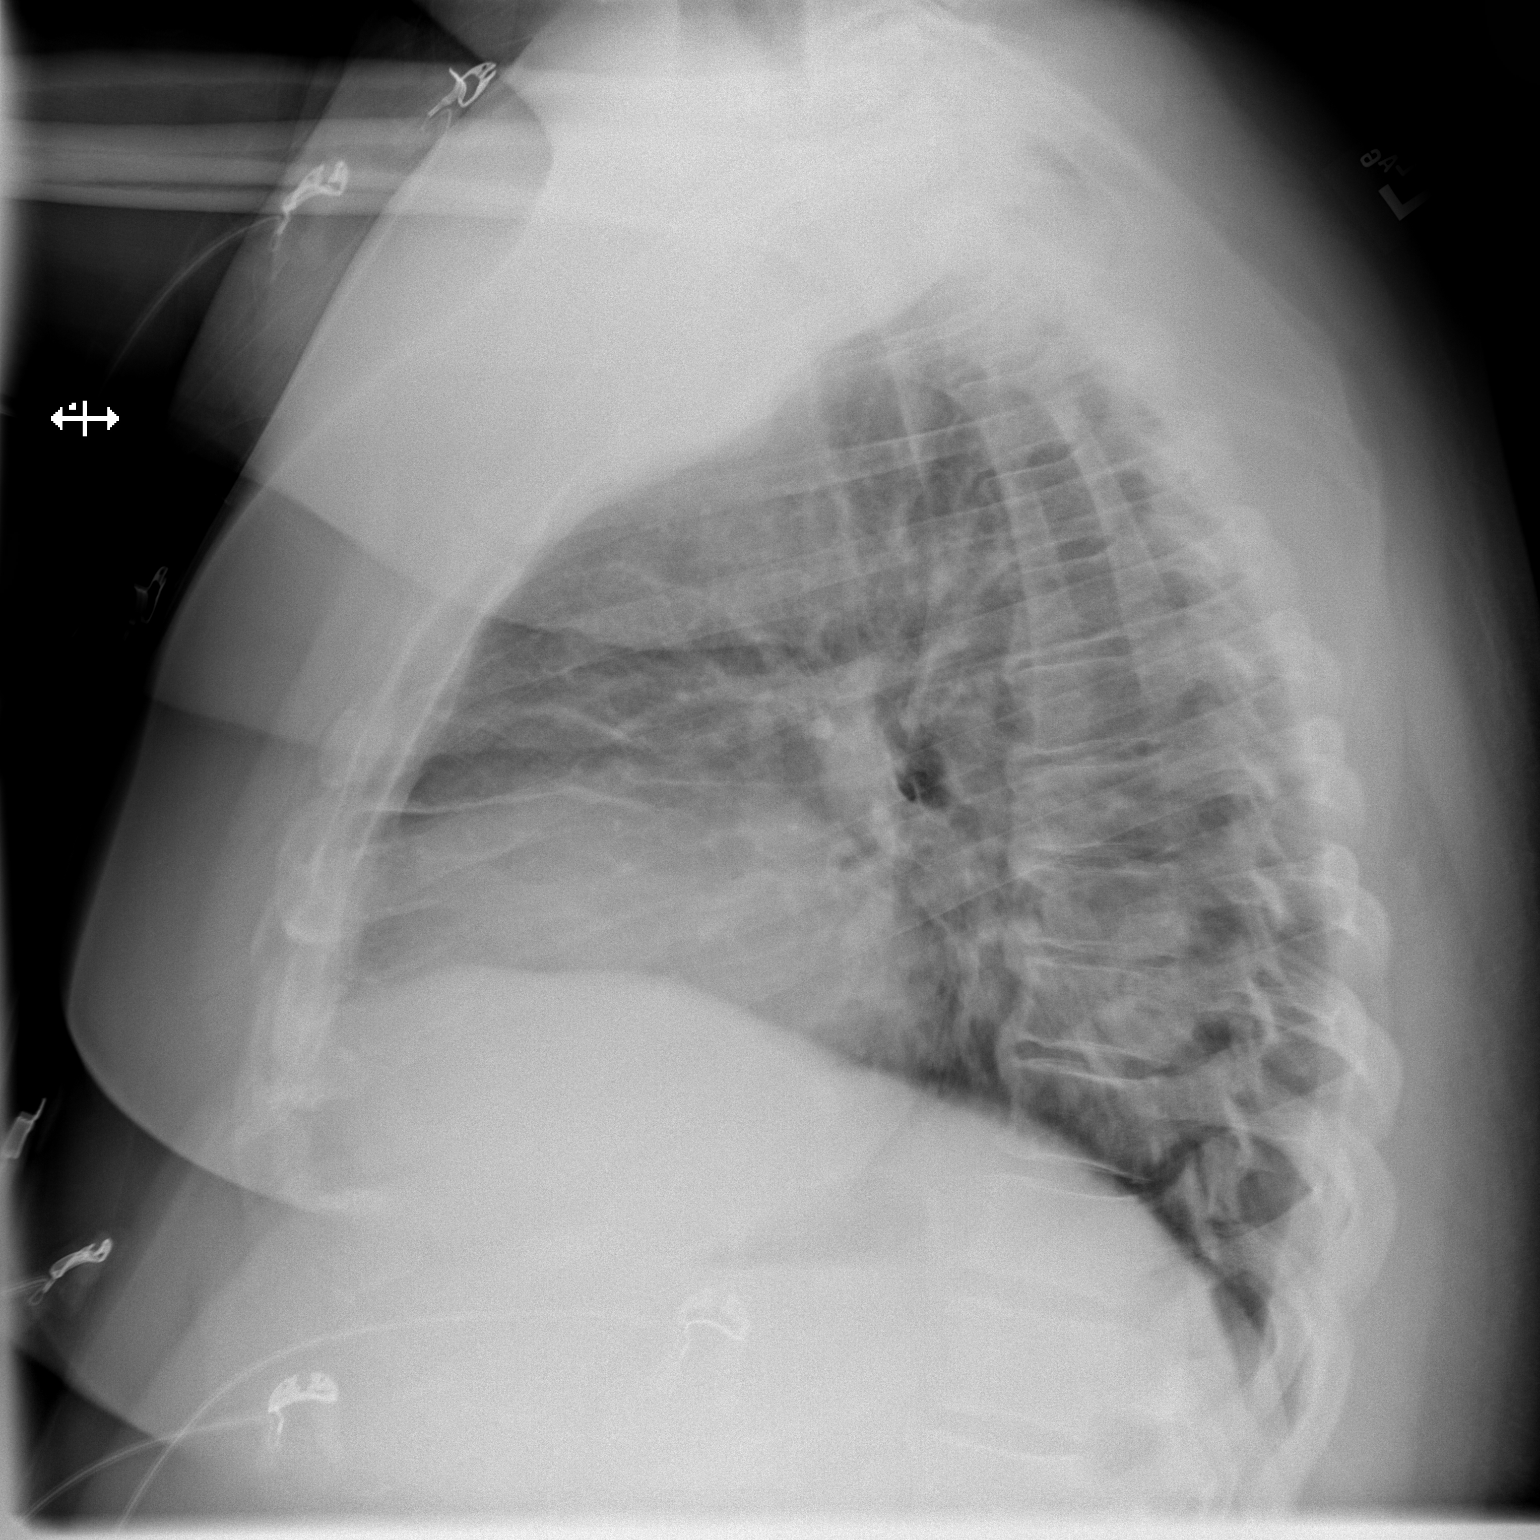

[2 of 2 positions shown; findings below may reference images not displayed]

FINDINGS: Lung volumes are normal. No consolidative airspace disease. No
pleural effusions. No pneumothorax. No pulmonary nodule or mass
noted. Pulmonary vasculature and the cardiomediastinal silhouette
are within normal limits.
IMPRESSION: No radiographic evidence of acute cardiopulmonary disease.

## 2022-07-26 IMAGING — CT CT ABD-PELV W/ CM
2 of 4 series · 16 of 46 positions shown, 18 images · IV contrast (OMNIPAQUE 350)
Comparison: Chest radiographs 9758 hours today.

CLINICAL DATA: 35-year-old male with abdominal pain radiating to
the ribs for 3 days.

EXAM:
CT ABDOMEN AND PELVIS WITH CONTRAST
TECHNIQUE: Multidetector CT imaging of the abdomen and pelvis was performed
using the standard protocol following bolus administration of
intravenous contrast.
CONTRAST:  100mL OMNIPAQUE IOHEXOL 350 MG/ML SOLN

[Series 2: axial st · axial · 0.91mm/px · z∈[+1193,+1603]mm · 13 of 94 slices shown, 15 images]
[im 6/94  soft-tissue]
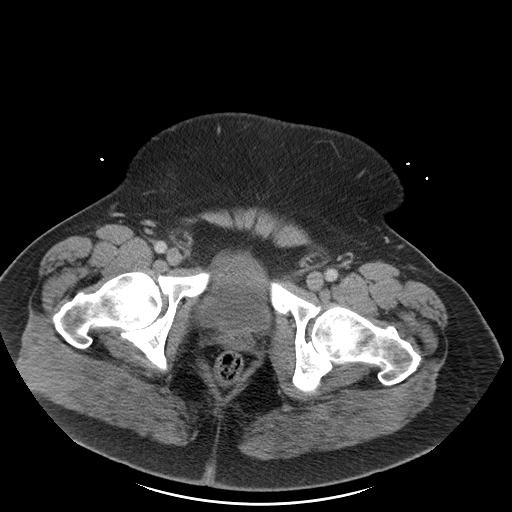
[im 6/94  bone]
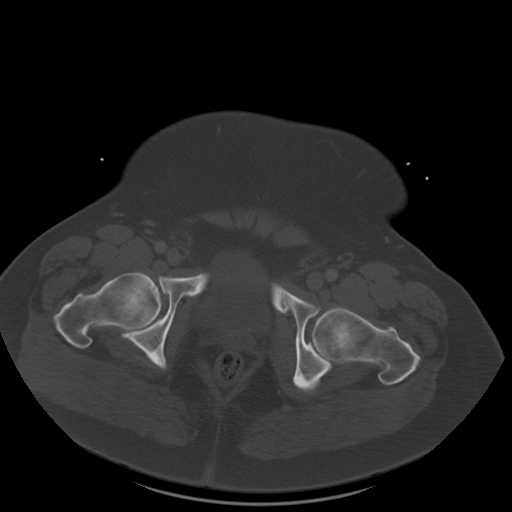
[im 11/94  soft-tissue]
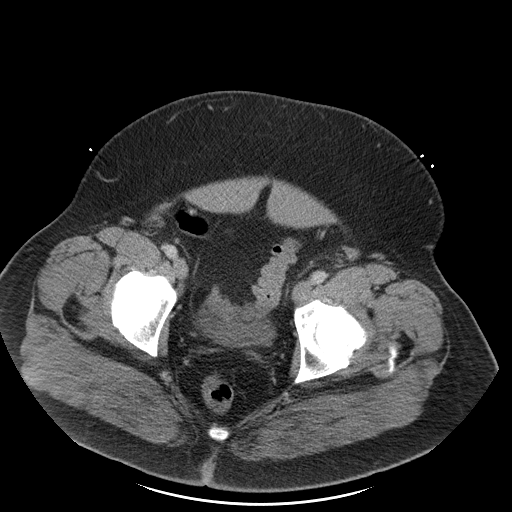
[im 22/94  soft-tissue]
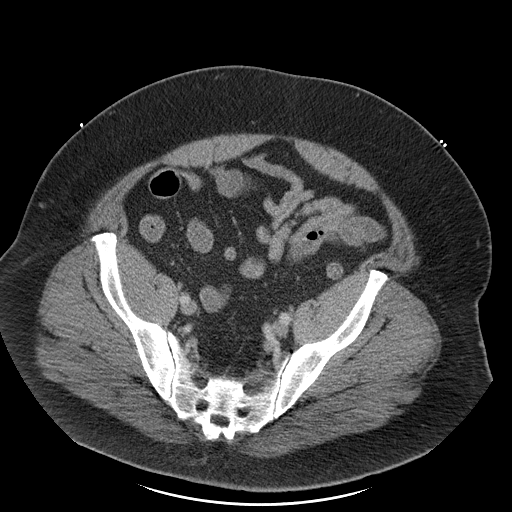
[im 28/94  soft-tissue]
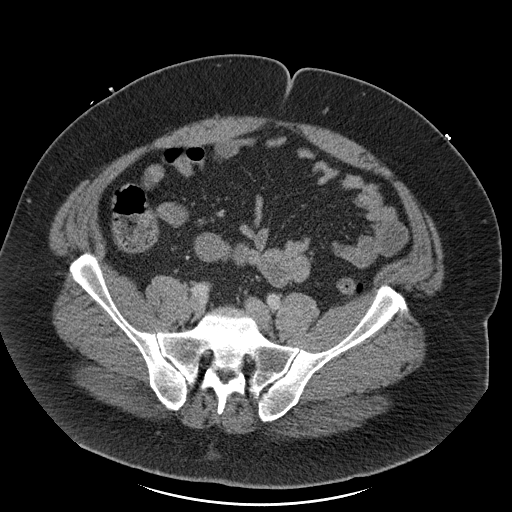
[im 33/94  soft-tissue]
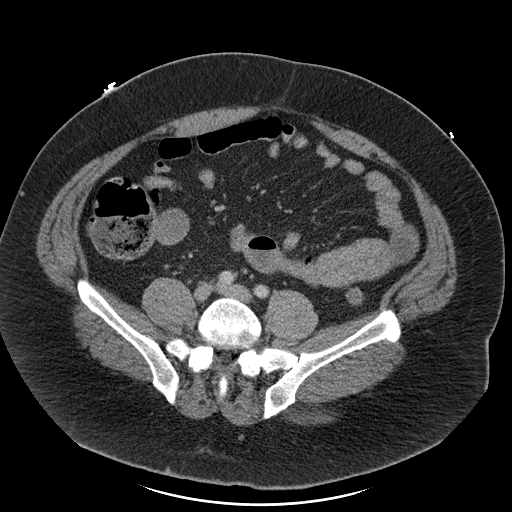
[im 39/94  soft-tissue]
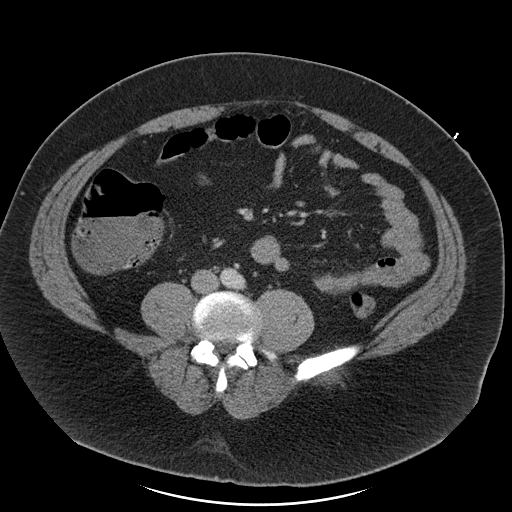
[im 50/94  soft-tissue]
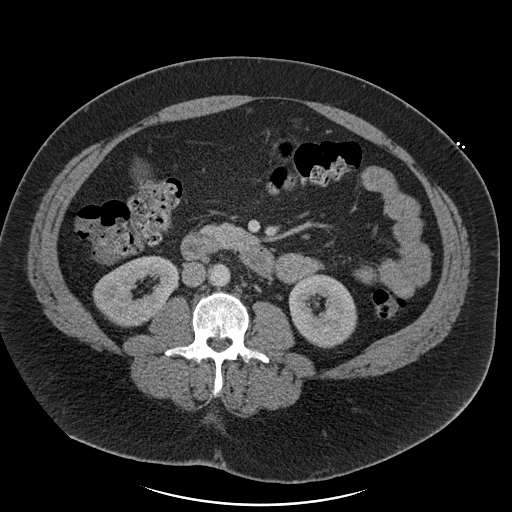
[im 55/94  soft-tissue]
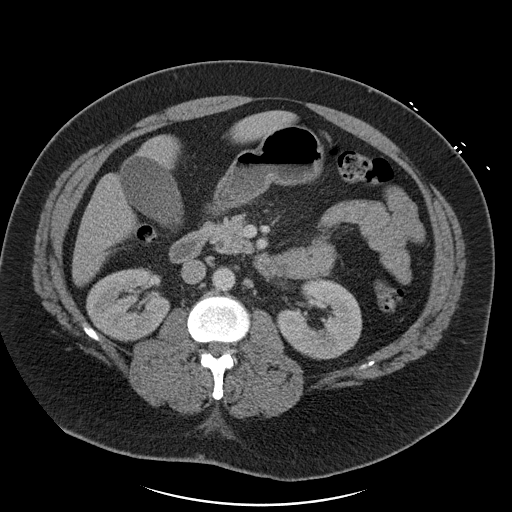
[im 61/94  soft-tissue]
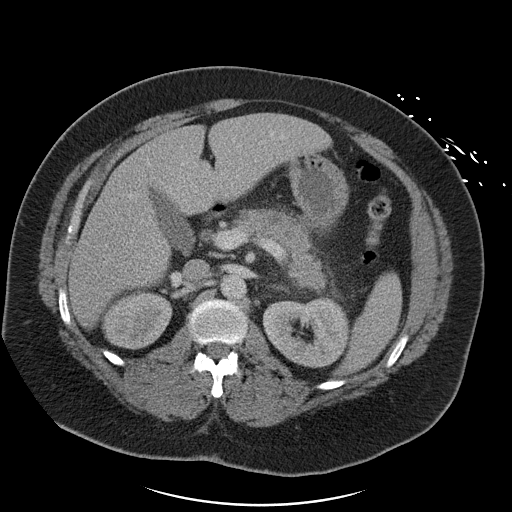
[im 61/94  bone]
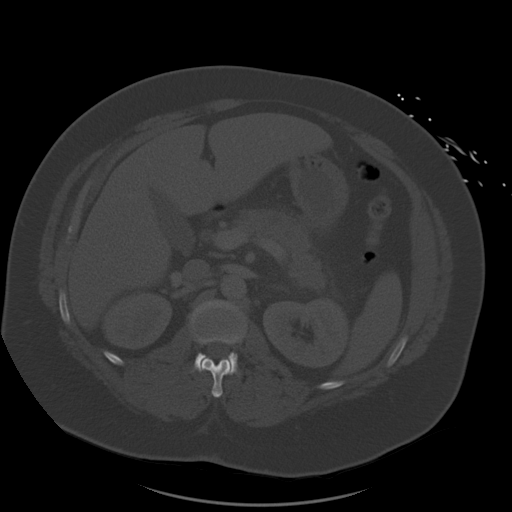
[im 66/94  soft-tissue]
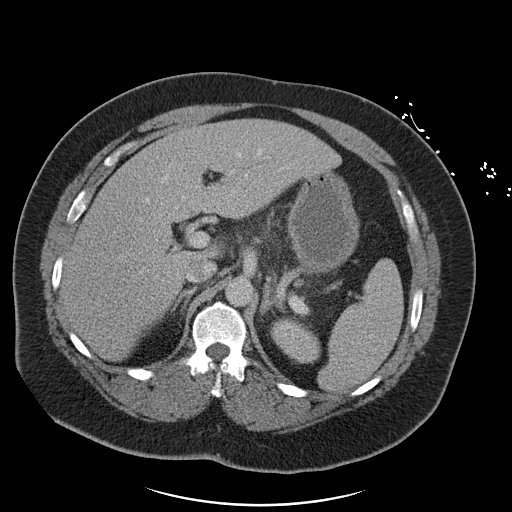
[im 72/94  soft-tissue]
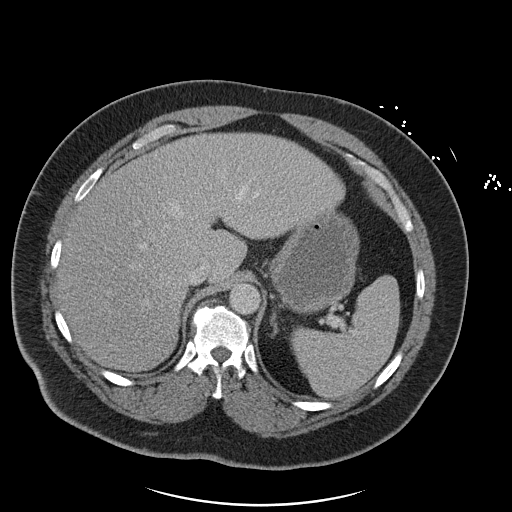
[im 83/94  soft-tissue]
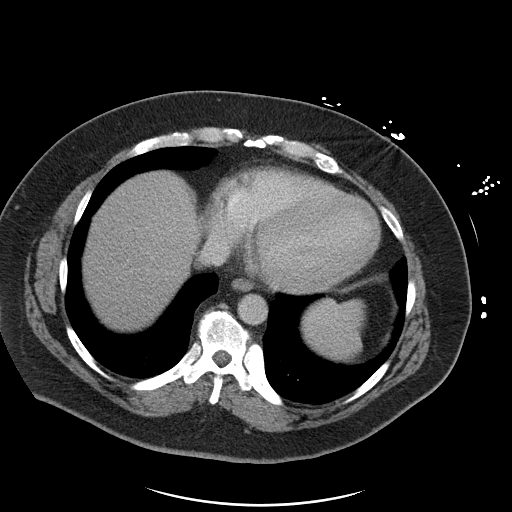
[im 88/94  soft-tissue]
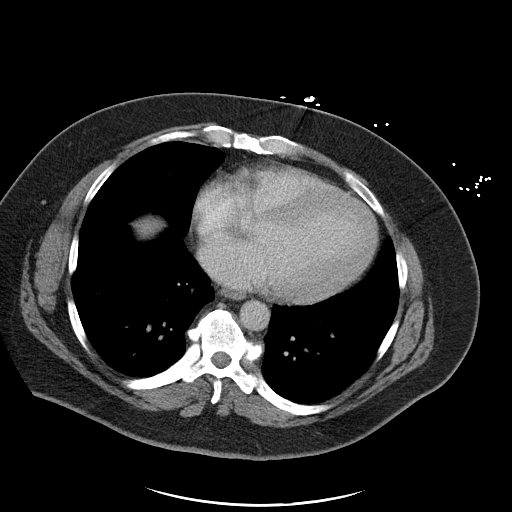

[Series 5: coronal st · coronal · 0.90mm/px · 3 of 180 slices shown]
[im 60/180  soft-tissue]
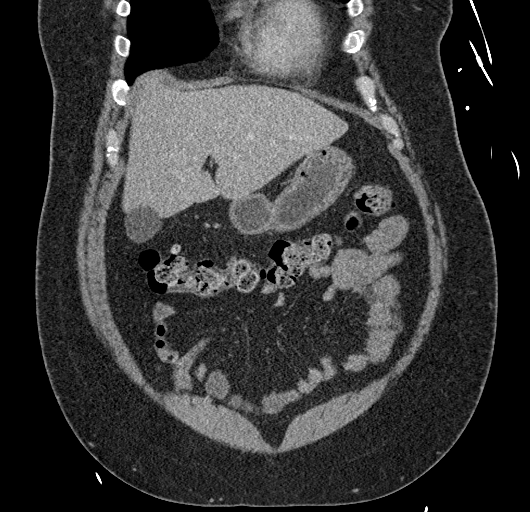
[im 80/180  soft-tissue]
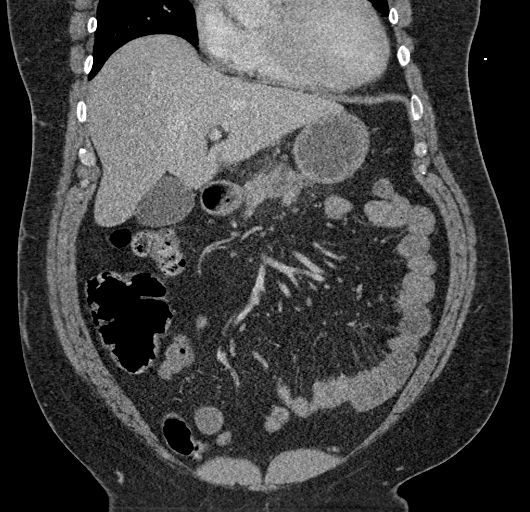
[im 100/180  soft-tissue]
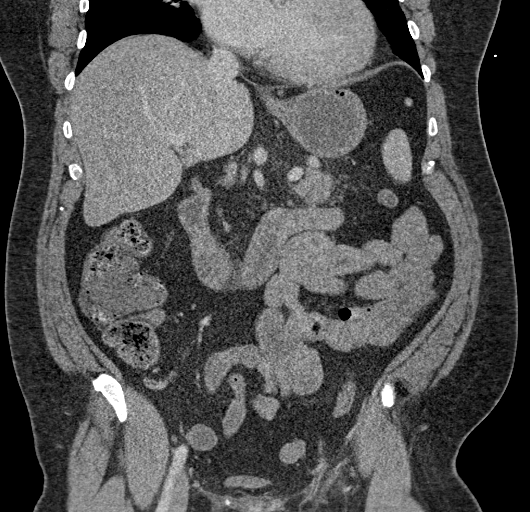

[16 of 46 positions shown; findings below may reference images not displayed]

FINDINGS: Lower chest: Cardiac size at the upper limits of normal. No
pericardial effusion. Lung bases are negative aside from minor
atelectasis.

Hepatobiliary: Negative liver and gallbladder. No bile duct
enlargement.

Pancreas: Inflamed pancreas (series 2, image 34) with surrounding
mild fat stranding and mildly heterogeneous parenchymal enhancement.
No main pancreatic ductal dilatation. No discrete free fluid or
fluid collection.

Spleen: Negative.

Adrenals/Urinary Tract: Normal adrenal glands. Nonobstructed kidneys
appears symmetric and normal. Decompressed ureters. Negative
bladder. Incidental left hemipelvis phlebolith.

Stomach/Bowel: Occasional diverticulosis in the large bowel,
including the descending and sigmoid colon. Retained stool in the
right colon but elsewhere the large bowel is decompressed. Normal
appendix on series 2, image 70. Negative terminal ileum. No dilated
small bowel. Stomach and duodenum remain within normal limits. No
free air or free fluid.

Vascular/Lymphatic: Major arterial structures in the abdomen and
pelvis appear patent and normal. Portal venous system is patent
including the splenic vein. No lymphadenopathy.

Reproductive: Negative.

Other: No pelvic free fluid.

Musculoskeletal: Chronic benign appearing, possibly posttraumatic,
overgrowth of the left 10th costovertebral junction. Flowing
endplate osteophytes in the lower thoracic spine results in some
levels of interbody ankylosis. No lower rib fracture identified. No
acute osseous abnormality identified.
IMPRESSION: 1. Acute Pancreatitis. No associated fluid collection or
complicating features.

2. No other acute or inflammatory process identified in the abdomen
or pelvis.

## 2022-08-21 ENCOUNTER — Encounter: Payer: Self-pay | Admitting: Neurology

## 2022-08-21 ENCOUNTER — Ambulatory Visit (INDEPENDENT_AMBULATORY_CARE_PROVIDER_SITE_OTHER): Payer: 59 | Admitting: Neurology

## 2022-08-21 VITALS — BP 182/112 | HR 99 | Ht 69.0 in | Wt 293.6 lb

## 2022-08-21 DIAGNOSIS — G4733 Obstructive sleep apnea (adult) (pediatric): Secondary | ICD-10-CM

## 2022-08-21 DIAGNOSIS — G4734 Idiopathic sleep related nonobstructive alveolar hypoventilation: Secondary | ICD-10-CM

## 2022-08-21 DIAGNOSIS — Z9622 Myringotomy tube(s) status: Secondary | ICD-10-CM | POA: Diagnosis not present

## 2022-08-21 DIAGNOSIS — Z789 Other specified health status: Secondary | ICD-10-CM | POA: Diagnosis not present

## 2022-08-21 NOTE — Progress Notes (Signed)
Provider:  Melvyn Novas, MD  Primary Care Physician:  Doran Stabler, DO [address] [place] [address]     Referring Provider: Doran Stabler, Do [address] [place],  [place] [address]          Chief Complaint according to patient   Patient presents with:     New Patient (Initial Visit)           HISTORY OF PRESENT ILLNESS:  Joel Li is a 37 y.o. male patient who is here for revisit 08/21/2022 for  CPAP difficulties. .  Chief concern according to patient :  I can't use CPAP, it just doesn't work. OSA (obstructive sleep apnea), severe Central Sleep Apnea  Nocturnal Hypoxemia.   The patient underwent a HST on 03-04-2022 , interpreted by Dr Frances Furbish.  She ordered on my behalf an auto titration CPAP AirSense 10 machine for the patient.  He was able to use the machine in March for about 50% of the nights but since April this has dropped.  He reports that there is a high air leakage even that he has already tried 2 different masks.  The patient also has facial hair which makes the air seal harder to obtain.  He also feels that he cannot sleep for the first 1 or 2 hours fairly easy but then the pressure just feels to imposing.  His residual AHI currently is 4.2 once he when he uses CPAP so I know that it does work for him.  I think my suggestion is to start with a lower pressure and allow the machine to have a greater variability between low and high pressure to find its way.  I also encouraged the patient to have several masks at home sometimes he may feel better with 1 and the other.  Nasal interface did lead to oral air leak.  FFM medium and large were given to him, and he has a full bread- this can't work.     TEST RESULTS per Dr Frances Furbish:   RECOMMENDATION:    This home sleep test demonstrates severe obstructive sleep apnea with a total AHI of 64.6/hour and O2 nadir of 78%, with significant time below or at 88% saturation of over 30 minutes for the night, indicating  nocturnal hypoxemia. Snoring was detected, in the moderate to loud range fairly consistently throughout the study.  Treatment with positive airway pressure is highly recommended. This will require - ideally - a full night CPAP titration study for proper treatment settings, O2 monitoring and mask fitting. For now, the patient will be advised to proceed with an autoPAP titration/trial at home. A laboratory attended titration study can be considered in the future for optimization of treatment settings and to improve tolerance and compliance. Alternative treatment options are limited secondary to the severity of the patient's sleep disordered breathing, but may include surgical treatment with an implantable hypoglossal nerve stimulator (in carefully selected candidates, meeting criteria).  Concomitant weight loss is recommended, where clinically appropriate. Please note, that untreated obstructive sleep apnea may carry additional perioperative morbidity. Patients with significant obstructive sleep apnea should receive perioperative PAP therapy and the surgeons and particularly the anesthesiologist should be informed of the diagnosis and the severity of the sleep disordered breathing. The patient should be cautioned not to drive, work at heights, or operate dangerous or heavy equipment when tired or sleepy. Review and reiteration of good sleep hygiene measures should be pursued with any patient.  Other causes of the patient's symptoms, including circadian rhythm disturbances, an underlying mood disorder, medication effect and/or an underlying medical problem cannot be ruled out based on this test. Clinical correlation is recommended.  The patient and his referring provider will be notified of the test results. The patient will be seen in follow up in sleep clinic at [facility].   I certify that I have reviewed the raw data recording prior to the issuance of this report in accordance with the standards of the American  Academy of Sleep Medicine (AASM).       INTERPRETING PHYSICIAN:    Huston Foley, MD, PhD  Review of Systems: Out of a complete 14 system review, the patient complains of only the following symptoms, and all other reviewed systems are negative.:  Fatigue, sleepiness , snoring, nocturia 3 times a night.   How likely are you to doze in the following situations: 0 = not likely, 1 = slight chance, 2 = moderate chance, 3 = high chance   Sitting and Reading? Watching Television? Sitting inactive in a public place (theater or meeting)? As a passenger in a car for an hour without a break? Lying down in the afternoon when circumstances permit? Sitting and talking to someone? Sitting quietly after lunch without alcohol? In a car, while stopped for a few minutes in traffic?   Total = 4/ 24 points   FSS endorsed at 34/ 63 points.   BMI 43.6  Social History   Socioeconomic History   Marital status: Single    Spouse name: Not on file   Number of children: Not on file   Years of education: Not on file   Highest education level: Not on file  Occupational History   Occupation: Production designer, theatre/television/film    Comment: produce company  Tobacco Use   Smoking status: Every Day    Packs/day: 0.50    Years: 10.00    Additional pack years: 0.00    Total pack years: 5.00    Types: Cigarettes   Smokeless tobacco: Never   Tobacco comments:    =.5 pkpd  Vaping Use   Vaping Use: Never used  Substance and Sexual Activity   Alcohol use: Yes    Alcohol/week: 5.0 standard drinks of alcohol    Types: 5 Shots of liquor per week    Comment: weekly   Drug use: Not Currently    Types: Marijuana   Sexual activity: Yes  Other Topics Concern   Not on file  Social History Narrative   Not on file   Social Determinants of Health   Financial Resource Strain: Not on file  Food Insecurity: Not on file  Transportation Needs: Not on file  Physical Activity: Not on file  Stress: Not on file  Social Connections: Not on  file    Family History  Problem Relation Age of Onset   Lung cancer Father    Hyperlipidemia Father    Cleft palate Brother     Past Medical History:  Diagnosis Date   Hypertension    Tobacco use disorder     History reviewed. No pertinent surgical history.   Current Outpatient Medications on File Prior to Visit  Medication Sig Dispense Refill   amlodipine-olmesartan (AZOR) 10-20 MG tablet Take 1 tablet by mouth daily. 90 tablet 3   buPROPion (WELLBUTRIN XL) 150 MG 24 hr tablet Take 1 tablet (150 mg total) by mouth daily. (Patient not taking: Reported on 08/21/2022) 30 tablet 1   No current facility-administered medications  on file prior to visit.    Allergies  Allergen Reactions   Penicillins Rash     DIAGNOSTIC DATA (LABS, IMAGING, TESTING) - I reviewed patient records, labs, notes, testing and imaging myself where available.  Lab Results  Component Value Date   WBC 15.0 (H) 02/20/2021   HGB 15.5 02/20/2021   HCT 44.1 02/20/2021   MCV 89.5 02/20/2021   PLT 307 02/20/2021      Component Value Date/Time   NA 140 11/08/2021 1353   K 3.8 11/08/2021 1353   CL 101 11/08/2021 1353   CO2 21 11/08/2021 1353   GLUCOSE 96 11/08/2021 1353   GLUCOSE 128 (H) 02/20/2021 0743   BUN 14 11/08/2021 1353   CREATININE 0.83 11/08/2021 1353   CREATININE 0.85 03/10/2013 0928   CALCIUM 9.2 11/08/2021 1353   PROT 7.9 02/20/2021 0743   PROT 6.8 12/10/2018 1015   ALBUMIN 4.2 02/20/2021 0743   ALBUMIN 4.2 12/10/2018 1015   AST 15 02/20/2021 0743   ALT 18 02/20/2021 0743   ALKPHOS 76 02/20/2021 0743   BILITOT 0.8 02/20/2021 0743   BILITOT 0.3 12/10/2018 1015   GFRNONAA >60 02/20/2021 0743   GFRAA 116 04/04/2020 1538   Lab Results  Component Value Date   CHOL 211 (H) 04/04/2020   HDL 43 04/04/2020   LDLCALC 116 (H) 04/04/2020   TRIG 159 (H) 03/02/2021   CHOLHDL 4.9 04/04/2020   Lab Results  Component Value Date   HGBA1C 5.3 04/04/2020   No results found for:  "VITAMINB12" No results found for: "TSH"  PHYSICAL EXAM:  Today's Vitals   08/21/22 1430  BP: (!) 182/112  Pulse: 99  Weight: 293 lb 9.6 oz (133.2 kg)  Height: 5\' 9"  (1.753 m)   Body mass index is 43.36 kg/m.   Wt Readings from Last 3 Encounters:  08/21/22 293 lb 9.6 oz (133.2 kg)  12/14/21 298 lb 12.8 oz (135.5 kg)  11/08/21 (!) 302 lb 14.4 oz (137.4 kg)     Ht Readings from Last 3 Encounters:  08/21/22 5\' 9"  (1.753 m)  12/14/21 5\' 9"  (1.753 m)  10/06/21 5\' 9"  (1.753 m)      General: The patient is awake, alert and appears not in acute distress. The patient is well groomed. Head: Normocephalic, atraumatic. Neck is supple. Mallampati  3-,  neck circumference:18.5 inches . Nasal airflow restricted- Retrognathia is not  seen.  Dental status: biological Cardiovascular:  Regular rate and cardiac rhythm by pulse,  without distended neck veins. Respiratory: Lungs are clear to auscultation.  Skin:  With evidence of ankle edema. Trunk: The patient's posture is erect.   Neurologic exam : The patient is awake and alert, oriented to place and time.   Memory subjective described as intact.  Attention span & concentration ability appears normal.  Speech is fluent,  without dysarthria, dysphonia or aphasia.  Mood and affect are appropriate.   Cranial nerves: no loss of smell or taste reported  Pupils are equal and briskly reactive to light. Funduscopic exam deferred. .  Extraocular movements in vertical and horizontal planes were intact and without nystagmus. No Diplopia. Visual fields by finger perimetry are intact. Hearing was intact to tuning fork air conduction but louder in the right to bone conduction.   Facial sensation intact to fine touch.  Facial motor strength is symmetric and tongue and uvula move midline.  Neck ROM : rotation, tilt and flexion extension were normal for age and shoulder shrug was symmetrical.  Motor exam:  Symmetric bulk, tone and ROM.   Normal  tone without cog wheeling, symmetric grip strength .   Sensory:  Fine touch, pinprick and vibration were tested  and  normal.  Proprioception tested in the upper extremities was normal.   Coordination: Rapid alternating movements in the fingers/hands were of normal speed.  The Finger-to-nose maneuver was intact without evidence of ataxia, dysmetria or tremor.   Gait and station: Patient could rise unassisted from a seated position, walked without assistive device.  Stance is of wider base and the patient turned with 3 steps.  Toe and heel walk were deferred.  Deep tendon reflexes: in the  upper and lower extremities are symmetric and intact.  Babinski response was deferred .   ASSESSMENT AND PLAN 37 y.o. year old male  here with:    1)  This severe sleep apnea with hypoxia does not allow for any other therapy than PAP therapy,  We have to work on a better air-seal ( shaving  his beard will be a good step) and I will open the setting of the CPAP machine .  If this doesn't work - and I expect his message  on mychart - over the next 30 days,  we will arrange for a BIPAP titration with 5 cm pressure gap.     I plan to follow up either personally or through our NP within 3-4 months.   I would like to thank Doran Stabler, DO for allowing me to meet with and to take care of this pleasant patient.    After spending a total time of  23  minutes face to face and additional time for physical and neurologic examination, review of laboratory studies,  personal review of imaging studies, reports and results of other testing and review of referral information / records as far as provided in visit,   Electronically signed by: Melvyn Novas, MD 08/21/2022 3:09 PM  [facility] Board certified by The [employer] of Sleep Medicine and Diplomate of the [employer] of Sleep Medicine. Board certified In Neurology through the ABPN, Fellow of the [employer] of Neurology. Medical Director of [facility].

## 2022-08-21 NOTE — Patient Instructions (Signed)
37 y.o. year old male here with: SEVERE OSA, HYPOXIA and difficulties tolerating CPAP.     1)  This severe sleep apnea with hypoxia does not allow for any other therapy than PAP therapy,  We have to work on a better air-seal ( shaving  his beard will be a good step) and I will meanwhile open the setting of the CPAP machine  5-20 cm water.  I will arrange for a BIPAP titration with 5 cm pressure gap in the near future. .     I plan to follow up either personally or through our NP within 3-4 months.   I would like to thank Doran Stabler, DO for allowing me to meet with and to take care of this pleasant patient.

## 2022-08-22 ENCOUNTER — Telehealth: Payer: Self-pay

## 2022-08-27 ENCOUNTER — Encounter: Payer: Self-pay | Admitting: *Deleted

## 2022-09-05 ENCOUNTER — Telehealth: Payer: Self-pay | Admitting: Neurology

## 2022-09-05 NOTE — Telephone Encounter (Signed)
[employer] pending uploaded notes  

## 2022-09-10 NOTE — Telephone Encounter (Signed)
[employer] Berkley Harvey: [id number] (exp. 09/05/22 to 12/04/22)

## 2022-09-11 NOTE — Telephone Encounter (Signed)
09/11/22 LVM [place]  09/10/22 [employer] auth: [id number] (exp. 09/05/22 to 12/04/22)

## 2022-10-15 ENCOUNTER — Other Ambulatory Visit: Payer: Self-pay

## 2022-10-15 ENCOUNTER — Emergency Department (HOSPITAL_COMMUNITY): Payer: 59

## 2022-10-15 ENCOUNTER — Encounter (HOSPITAL_COMMUNITY): Payer: Self-pay

## 2022-10-15 ENCOUNTER — Emergency Department (HOSPITAL_COMMUNITY)
Admission: EM | Admit: 2022-10-15 | Discharge: 2022-10-15 | Disposition: A | Discharge status: Home / Self Care | Payer: 59 | Attending: Emergency Medicine | Admitting: Emergency Medicine

## 2022-10-15 DIAGNOSIS — R5383 Other fatigue: Secondary | ICD-10-CM | POA: Diagnosis present

## 2022-10-15 DIAGNOSIS — I160 Hypertensive urgency: Secondary | ICD-10-CM | POA: Diagnosis not present

## 2022-10-15 DIAGNOSIS — I10 Essential (primary) hypertension: Secondary | ICD-10-CM | POA: Insufficient documentation

## 2022-10-15 DIAGNOSIS — E876 Hypokalemia: Secondary | ICD-10-CM | POA: Diagnosis not present

## 2022-10-15 LAB — CBC WITH DIFFERENTIAL/PLATELET
Abs Immature Granulocytes: 0.06 10*3/uL (ref 0.00–0.07)
Basophils Absolute: 0 10*3/uL (ref 0.0–0.1)
Basophils Relative: 0 %
Eosinophils Absolute: 0.1 10*3/uL (ref 0.0–0.5)
Eosinophils Relative: 2 %
HCT: 33.5 % — ABNORMAL LOW (ref 39.0–52.0)
Hemoglobin: 11.4 g/dL — ABNORMAL LOW (ref 13.0–17.0)
Immature Granulocytes: 1 %
Lymphocytes Relative: 15 %
Lymphs Abs: 1.4 10*3/uL (ref 0.7–4.0)
MCH: 30.2 pg (ref 26.0–34.0)
MCHC: 34 g/dL (ref 30.0–36.0)
MCV: 88.6 fL (ref 80.0–100.0)
Monocytes Absolute: 0.6 10*3/uL (ref 0.1–1.0)
Monocytes Relative: 7 %
Neutro Abs: 6.9 10*3/uL (ref 1.7–7.7)
Neutrophils Relative %: 75 %
Platelets: 234 10*3/uL (ref 150–400)
RBC: 3.78 MIL/uL — ABNORMAL LOW (ref 4.22–5.81)
RDW: 13.6 % (ref 11.5–15.5)
WBC: 9.2 10*3/uL (ref 4.0–10.5)
nRBC: 0 % (ref 0.0–0.2)

## 2022-10-15 LAB — COMPREHENSIVE METABOLIC PANEL
ALT: 17 U/L (ref 0–44)
AST: 17 U/L (ref 15–41)
Albumin: 4 g/dL (ref 3.5–5.0)
Alkaline Phosphatase: 76 U/L (ref 38–126)
Anion gap: 8 (ref 5–15)
BUN: 18 mg/dL (ref 6–20)
CO2: 24 mmol/L (ref 22–32)
Calcium: 8.5 mg/dL — ABNORMAL LOW (ref 8.9–10.3)
Chloride: 102 mmol/L (ref 98–111)
Creatinine, Ser: 1.53 mg/dL — ABNORMAL HIGH (ref 0.61–1.24)
GFR, Estimated: 59 mL/min — ABNORMAL LOW (ref >60)
Glucose, Bld: 117 mg/dL — ABNORMAL HIGH (ref 70–99)
Potassium: 2.7 mmol/L — CL (ref 3.5–5.1)
Sodium: 134 mmol/L — ABNORMAL LOW (ref 135–145)
Total Bilirubin: 0.7 mg/dL (ref 0.3–1.2)
Total Protein: 7.4 g/dL (ref 6.5–8.1)

## 2022-10-15 LAB — BRAIN NATRIURETIC PEPTIDE: B Natriuretic Peptide: 205.6 pg/mL — ABNORMAL HIGH (ref 0.0–100.0)

## 2022-10-15 MED ORDER — AMLODIPINE-OLMESARTAN 10-20 MG PO TABS: 1.0000 | ORAL_TABLET | Freq: Every day | ORAL | 3 refills | Status: DC

## 2022-10-15 MED ORDER — AMLODIPINE BESYLATE 5 MG PO TABS
10.0000 mg | ORAL_TABLET | Freq: Once | ORAL | Status: AC
  Administered 2022-10-15: 10 mg via ORAL
  Filled 2022-10-15: qty 2

## 2022-10-15 MED ORDER — POTASSIUM CHLORIDE CRYS ER 20 MEQ PO TBCR
60.0000 meq | EXTENDED_RELEASE_TABLET | Freq: Once | ORAL | Status: AC
  Administered 2022-10-15: 60 meq via ORAL
  Filled 2022-10-15: qty 3

## 2022-10-15 MED ORDER — AMLODIPINE-OLMESARTAN 10-20 MG PO TABS
1.0000 | ORAL_TABLET | Freq: Once | ORAL | Status: DC
Start: 2022-10-15 — End: 2022-10-15

## 2022-10-15 MED ORDER — IRBESARTAN 150 MG PO TABS
150.0000 mg | ORAL_TABLET | Freq: Once | ORAL | Status: AC
  Administered 2022-10-15: 150 mg via ORAL
  Filled 2022-10-15: qty 1

## 2022-10-15 NOTE — ED Provider Notes (Signed)
[name] Provider Note   CSN: [id number] Arrival date & time: 10/15/22  8657     History  Chief Complaint  Patient presents with   Leg Swelling    Joel Li is a 37 y.o. male.  HPI Patient with acknowledged history of hypertension, no recent medication use presents with concern for dyspnea, fatigue, lower extremity swelling. Seemingly the patient has not taken medication in about 1 year.  However, until about a week ago patient states that he was feeling unremarkably. Now over the past week patient notes increasing fatigue, lower extremity swelling, and decreased capacity to perform ADL.  No focal chest pain, minimal dyspnea when sitting upright, but the patient is unable to lay in supine positioning.     Home Medications Prior to Admission medications   Medication Sig Start Date End Date Taking? Authorizing Provider  amlodipine-olmesartan (AZOR) 10-20 MG tablet Take 1 tablet by mouth daily. 10/15/22   Gerhard Munch, MD  buPROPion (WELLBUTRIN XL) 150 MG 24 hr tablet Take 1 tablet (150 mg total) by mouth daily. Patient not taking: Reported on 08/21/2022 12/14/21   Dohmeier, Porfirio Mylar, MD      Allergies    Penicillins    Review of Systems   Review of Systems  All other systems reviewed and are negative.   Physical Exam Updated Vital Signs BP (!) 195/138   Pulse 87   Temp 98.2 F (36.8 C) (Oral)   Resp (!) 22   Ht 5\' 9"  (1.753 m)   Wt 133.4 kg   SpO2 99%   BMI 43.42 kg/m  Physical Exam Vitals and nursing note reviewed.  Constitutional:      General: He is not in acute distress.    Appearance: He is well-developed. He is obese. He is not ill-appearing or diaphoretic.  HENT:     Head: Normocephalic and atraumatic.  Eyes:     Conjunctiva/sclera: Conjunctivae normal.  Cardiovascular:     Rate and Rhythm: Normal rate and regular rhythm.  Pulmonary:     Effort: Pulmonary effort is normal. No respiratory distress.      Breath sounds: No stridor.  Abdominal:     General: There is no distension.  Skin:    General: Skin is warm and dry.  Neurological:     Mental Status: He is alert and oriented to person, place, and time.     ED Results / Procedures / Treatments   Labs (all labs ordered are listed, but only abnormal results are displayed) Labs Reviewed  BRAIN NATRIURETIC PEPTIDE - Abnormal; Notable for the following components:      Result Value   B Natriuretic Peptide 205.6 (*)    All other components within normal limits  COMPREHENSIVE METABOLIC PANEL - Abnormal; Notable for the following components:   Sodium 134 (*)    Potassium 2.7 (*)    Glucose, Bld 117 (*)    Creatinine, Ser 1.53 (*)    Calcium 8.5 (*)    GFR, Estimated 59 (*)    All other components within normal limits  CBC WITH DIFFERENTIAL/PLATELET - Abnormal; Notable for the following components:   RBC 3.78 (*)    Hemoglobin 11.4 (*)    HCT 33.5 (*)    All other components within normal limits  URINALYSIS, ROUTINE W REFLEX MICROSCOPIC    EKG EKG Interpretation Date/Time:  Monday October 15 2022 08:52:25 EDT Ventricular Rate:  98 PR Interval:  175 QRS Duration:  116 QT Interval:  411 QTC Calculation: 525 R Axis:   34  Text Interpretation: Sinus rhythm LAE, consider biatrial enlargement Nonspecific intraventricular conduction delay Nonspecific T abnormalities, lateral leads Abnormal ECG Confirmed by Gerhard Munch [phone]) on 10/15/2022 9:27:12 AM  Radiology DG Chest Port 1 View  Result Date: 10/15/2022 CLINICAL DATA:  Shortness of breath, leg swelling EXAM: PORTABLE CHEST 1 VIEW COMPARISON:  02/20/2021 FINDINGS: Cardiomegaly. Diffuse bilateral interstitial opacity. The visualized skeletal structures are unremarkable. IMPRESSION: Cardiomegaly with diffuse bilateral interstitial pulmonary opacity, consistent with edema. No focal airspace opacity. Electronically Signed   By: Jearld Lesch M.D.   On: 10/15/2022 09:16     Procedures Procedures    Medications Ordered in ED Medications  potassium chloride SA (KLOR-CON M) CR tablet 60 mEq (has no administration in time range)  amlodipine-olmesartan (AZOR) 10-20 MG per tablet 1 tablet (has no administration in time range)    ED Course/ Medical Decision Making/ A&P                             Medical Decision Making Obese adult male with history of hypertension presents with dyspnea, fatigue, concern for swelling.  On arrival patient is hypertensive, systolic greater than 230, some suspicion for pulmonary edema, hypertensive crisis, less likely CHF, PE, ACS. Cardiac 85 sinus normal Pulse ox 100% room air normal   Amount and/or Complexity of Data Reviewed External Data Reviewed: notes.    Details: Prior clinic notes reviewed Labs: ordered. Decision-making details documented in ED Course. Radiology: ordered and independent interpretation performed. Decision-making details documented in ED Course. ECG/medicine tests: ordered and independent interpretation performed. Decision-making details documented in ED Course.  Risk Prescription drug management. Decision regarding hospitalization. Diagnosis or treatment significantly limited by social determinants of health.   11:00 AM Patient awake, alert, in no distress sitting upright speaking easily, no increased work of breathing.  Vital signs notable for no hypoxia, no tachycardia.  Patient has minimal tachypnea.  Patient is less hypertensive than on arrival with a decrease of approximately 15%.  He and I discussed all findings, notable for mild hypokalemia, slight worsening of renal function, as well as slight elevation in BNP.  All findings consistent with medication noncompliance in a patient with known hypertension, obesity and smoking use disorder.  Patient has been provided his home medications here as well as potassium supplementation.  Without evidence for other acute changes, with suspicion as above  for medication noncompliance leading to multiple lab abnormalities, and the patient's symptoms, he and I discussed initiation of outpatient therapy versus admission, the patient states that he is comfortable following up with this clinic after medications have been restarted.  Patient will follow-up closely as an outpatient.        Final Clinical Impression(s) / ED Diagnoses Final diagnoses:  Hypertensive urgency  Hypokalemia    Rx / DC Orders ED Discharge Orders          Ordered    amlodipine-olmesartan (AZOR) 10-20 MG tablet  Daily        10/15/22 1059              Gerhard Munch, MD 10/15/22 1100

## 2022-10-15 NOTE — Discharge Instructions (Addendum)
Today's evaluation has demonstrated the importance of obtaining and taking your prescribed medications as directed, regularly.  Please obtain and use your medication.  Call today for follow-up with your clinic.  In particular request repeat laboratory evaluation to ensure that your values including potassium have returned to normal.  Return here for concerning changes in your condition.

## 2022-10-15 NOTE — ED Triage Notes (Addendum)
Patient has had swelling in both legs for the past week. Still able to feel feet. Feels short of breath when he ambulates. Has not been taking his blood pressure medication for months. Unable to sleep last night due to shortness of breath. Has to sit up with pillows. No chest pain.

## 2022-10-25 ENCOUNTER — Other Ambulatory Visit: Payer: Self-pay | Admitting: Student

## 2022-10-25 ENCOUNTER — Encounter: Payer: Self-pay | Admitting: Student

## 2022-10-25 ENCOUNTER — Other Ambulatory Visit: Payer: Self-pay

## 2022-10-25 ENCOUNTER — Ambulatory Visit (INDEPENDENT_AMBULATORY_CARE_PROVIDER_SITE_OTHER): Payer: 59 | Admitting: Student

## 2022-10-25 VITALS — BP 148/93 | HR 89 | Temp 97.6°F | Ht 69.0 in | Wt 300.8 lb

## 2022-10-25 DIAGNOSIS — N179 Acute kidney failure, unspecified: Secondary | ICD-10-CM

## 2022-10-25 DIAGNOSIS — Z720 Tobacco use: Secondary | ICD-10-CM

## 2022-10-25 DIAGNOSIS — I10 Essential (primary) hypertension: Secondary | ICD-10-CM

## 2022-10-25 DIAGNOSIS — Z6841 Body Mass Index (BMI) 40.0 and over, adult: Secondary | ICD-10-CM

## 2022-10-25 DIAGNOSIS — R739 Hyperglycemia, unspecified: Secondary | ICD-10-CM | POA: Insufficient documentation

## 2022-10-25 DIAGNOSIS — F17210 Nicotine dependence, cigarettes, uncomplicated: Secondary | ICD-10-CM

## 2022-10-25 HISTORY — DX: Acute kidney failure, unspecified: N17.9

## 2022-10-25 MED ORDER — VARENICLINE TARTRATE (STARTER) 0.5 MG X 11 & 1 MG X 42 PO TBPK: ORAL_TABLET | ORAL | 0 refills | Status: AC

## 2022-10-25 MED ORDER — HYDROCHLOROTHIAZIDE 12.5 MG PO CAPS: 12.5000 mg | ORAL_CAPSULE | Freq: Every day | ORAL | 2 refills | Status: DC

## 2022-10-25 MED ORDER — VARENICLINE TARTRATE 1 MG PO TABS: 1.0000 mg | ORAL_TABLET | Freq: Two times a day (BID) | ORAL | 1 refills | Status: DC

## 2022-10-25 NOTE — Assessment & Plan Note (Signed)
BMI 44.4 today with comorbid hypertension.  Elevated glucose on prior labs.  Plan -Check A1c

## 2022-10-25 NOTE — Assessment & Plan Note (Signed)
Patient had a creatinine of 1.53 at last ED visit.  Baseline around 0.8.  Etiology of AKI unclear.  Patient denies nausea vomiting, dehydration, diarrhea around the time of his ED visit.  Plan: - BMP to assess kidney function.

## 2022-10-25 NOTE — Assessment & Plan Note (Addendum)
Patient states that he recently increased his cigarette use.  He was smoking about half a pack a day before, but since his last ED visit he has been up to around a pack a day.  He says that he would like to cut down and is ready to quit.  We discussed using chantix, including how to take it and common side effects of this medication.  The patient was informed to begin the medication 1 week prior to his quit date.  Plan: - Begin chantix

## 2022-10-25 NOTE — Assessment & Plan Note (Addendum)
Patient has a history of hypertension, however was not taking his medications.  They recently presented to the ED with systolic blood pressure of 200.  At the time they are complaining of multiday history of orthopnea, shortness of breath, swelling of the legs.  Patient's symptoms resolved with blood pressure control.  The patient has a history of obstructive sleep apnea, for which they follow-up with neurology.  He has not been using his CPAP recently.  Patient was found to be hypokalemic to 2.7, hyponatremic to 134, with elevated creatinine.  He was discharged with amlodipine olmesartan 10-20 combination pill.  He reports he has been taking this medication consistently, and his symptoms have resolved.  He denies nausea vomiting, fever, chills, shortness of breath, chest pain, orthopnea.  It is not evident what causes patient's hypertension.  This patient's hypokalemia and hypertension raise questions about potential secondary underlying causes for his hypertension.  We will obtain renin to aldosterone ratio to rule out primary hyperaldosteronism.  We will also obtain urine and serum osmolarity / electrolyte studies in order to calculate TTKG.  Plan: -HCTZ 12.5 mg in addition to current antihypertensive regimen. - Renin to aldosterone ratio - BMP - Magnesium - Serum osmolarity -Urine osmolarity - Urine potassium

## 2022-10-25 NOTE — Progress Notes (Addendum)
Subjective:  CC: ED follow-up for hypertension, hypokalemia, AKI  HPI:  [name] is a 37 y.o. male with a past medical history stated below and presents today for ED follow-up. Please see problem based assessment and plan for additional details.  Past Medical History:  Diagnosis Date   Hypertension    Tobacco use disorder     Current Outpatient Medications on File Prior to Visit  Medication Sig Dispense Refill   amlodipine-olmesartan (AZOR) 10-20 MG tablet Take 1 tablet by mouth daily. 90 tablet 3   No current facility-administered medications on file prior to visit.    Review of Systems: ROS negative except for what is noted on the assessment and plan.  Objective:   Vitals:   10/25/22 1416 10/25/22 1425  BP: (!) 150/90 (!) 148/93  Pulse: 92 89  Temp: 97.6 F (36.4 C)   TempSrc: Oral   SpO2: 98%   Weight: (!) 300 lb 12.8 oz (136.4 kg)   Height: 5\' 9"  (1.753 m)     Physical Exam: Constitutional: well-appearing in no acute distress HENT: normocephalic atraumatic, mucous membranes moist Eyes: conjunctiva non-erythematous Neck: supple Cardiovascular: regular rate and rhythm, no m/r/g no JVD Pulmonary/Chest: normal work of breathing on room air, lungs clear to auscultation bilaterally Abdominal: soft, non-tender, non-distended no costovertebral angle tenderness. MSK: normal bulk and tone Skin: warm and dry no lower extremity edema Psych: Mood and affect   Assessment & Plan:  Hypertension Patient has a history of hypertension, however was not taking his medications.  They recently presented to the ED with systolic blood pressure of 200.  At the time they are complaining of multiday history of orthopnea, shortness of breath, swelling of the legs.  Patient's symptoms resolved with blood pressure control.  The patient has a history of obstructive sleep apnea, for which they follow-up with neurology.  He has not been using his CPAP recently.  Patient was found  to be hypokalemic to 2.7, hyponatremic to 134, with elevated creatinine.  He was discharged with amlodipine olmesartan 10-20 combination pill.  He reports he has been taking this medication consistently, and his symptoms have resolved.  He denies nausea vomiting, fever, chills, shortness of breath, chest pain, orthopnea.  It is not evident what causes patient's hypertension.  This patient's hypokalemia and hypertension raise questions about potential secondary underlying causes for his hypertension.  We will obtain renin to aldosterone ratio to rule out primary hyperaldosteronism.  We will also obtain urine and serum osmolarity / electrolyte studies in order to calculate TTKG.  Plan: -HCTZ 12.5 mg in addition to current antihypertensive regimen. - Renin to aldosterone ratio - BMP - Magnesium - Serum osmolarity -Urine osmolarity - Urine potassium  Acute kidney injury [facility]) Patient had a creatinine of 1.53 at last ED visit.  Baseline around 0.8.  Etiology of AKI unclear.  Patient denies nausea vomiting, dehydration, diarrhea around the time of his ED visit.  Plan: - BMP to assess kidney function.  Tobacco abuse Patient states that he recently increased his cigarette use.  He was smoking about half a pack a day before, but since his last ED visit he has been up to around a pack a day.  He says that he would like to cut down and is ready to quit.  We discussed using chantix, including how to take it and common side effects of this medication.  The patient was informed to begin the medication 1 week prior to his quit date.  Plan: -  Begin chantix  Obesity BMI 44.4 today with comorbid hypertension.  Elevated glucose on prior labs.  Plan -Check A1c   Patient seen with Dr. Sullivan Lone MD [facility]  PGY-1 Pager: [phone]  Phone: [phone] Date 10/29/2022  Time 9:55 AM

## 2022-10-25 NOTE — Patient Instructions (Addendum)
Thank you, Joel Li for allowing [place] to provide your care today. Today we discussed ED follow up and blood pressure.    I have ordered the following labs for you:  Lab Orders         BMP8+Anion Gap         Hemoglobin A1c         Aldosterone/Renin         Magnesium         Osmolality, Urine         Potassium, Urine         Osmolality, Serum       Referrals ordered today:   Referral Orders  No referral(s) requested today     I have ordered the following medication/changed the following medications:   Stop the following medications: Medications Discontinued During This Encounter  Medication Reason   buPROPion (WELLBUTRIN XL) 150 MG 24 hr tablet      Start the following medications: Meds ordered this encounter  Medications   Varenicline Tartrate, Starter, (CHANTIX STARTING MONTH PAK) 0.5 MG X 11 & 1 MG X 42 TBPK    Sig: Take 0.5 mg by mouth daily for 3 days, THEN 0.5 mg in the morning and at bedtime for 3 days, THEN 1 mg in the morning and at bedtime for 21 days.    Dispense:  1 each    Refill:  0   varenicline (CHANTIX CONTINUING MONTH PAK) 1 MG tablet    Sig: Take 1 tablet (1 mg total) by mouth 2 (two) times daily.    Dispense:  60 tablet    Refill:  1   hydrochlorothiazide (MICROZIDE) 12.5 MG capsule    Sig: Take 1 capsule (12.5 mg total) by mouth daily.    Dispense:  30 capsule    Refill:  2     Follow up: 2 months   We look forward to seeing you next time. Please call our clinic at [phone] if you have any questions or concerns. The best time to call is Monday-Friday from 9am-4pm, but there is someone available 24/7. If after hours or the weekend, call the main hospital number and ask for the Internal Medicine Resident On-Call. If you need medication refills, please notify your pharmacy one week in advance and they will send [place] a request.   Thank you for trusting me with your care. Wishing you the best!  Lovie Macadamia MD [facility]  Center

## 2022-10-25 NOTE — Assessment & Plan Note (Signed)
>>  ASSESSMENT AND PLAN FOR OBESITY WRITTEN ON 10/25/2022  6:09 PM BY Lovie Macadamia, MD  BMI 44.4 today with comorbid hypertension.  Elevated glucose on prior labs.  Plan -Check A1c

## 2022-10-26 LAB — HEMOGLOBIN A1C: Est. average glucose Bld gHb Est-mCnc: 103 mg/dL

## 2022-10-26 LAB — BMP8+ANION GAP: Sodium: 138 mmol/L (ref 134–144)

## 2022-10-26 NOTE — Progress Notes (Signed)
Internal Medicine Clinic Attending  I was physically present during the key portions of the resident provided service and participated in the medical decision making of patient's management care. I reviewed pertinent patient test results.  The assessment, diagnosis, and plan were formulated together and I agree with the documentation in the resident's note.  Erlinda Hong, MD FACP

## 2022-11-05 ENCOUNTER — Telehealth: Payer: Self-pay | Admitting: *Deleted

## 2022-11-06 NOTE — Telephone Encounter (Signed)
Patient lab results discussed via MyChart. BMP, Mag, A1C WNL. Osmolarity studies normal, TTKG unremarkable in setting of normokalemia. Renin activity increased, likely in setting of ARB use.

## 2022-11-26 ENCOUNTER — Telehealth: Payer: Self-pay | Admitting: *Deleted

## 2022-11-26 NOTE — Telephone Encounter (Signed)
Please refer to message below.  ----- Message -----  From: Alecia Lemming  Sent: 11/21/2022  10:58 AM EDT  To: Imp Front Desk Pool  Subject: Appointment Request                              Appointment Request From: Alecia Lemming    With Provider: Milus Banister [facility]]    Preferred Date Range: 11/22/2022 - 11/30/2022    Preferred Times: Any Time    Reason for visit: Request an Appointment    Comments:  Eye doctor should call. He believes I have a blood clot around my neck. Lost all vision in my left eye. [facility] [address], [place], [place] [address] Phone number [phone] Dr. Allyne Gee. He should also contact Dr over there.    I called and talked to pt. Pt stated stated the eye doctor was going to call and talk to a doctor - no notes in chart. And was told by the doctor to schedule an appt w/his PCP within a week. First available appt schedule - Thursday with Dr Nooruddin.

## 2022-11-29 ENCOUNTER — Ambulatory Visit (INDEPENDENT_AMBULATORY_CARE_PROVIDER_SITE_OTHER): Payer: 59 | Admitting: Student

## 2022-11-29 ENCOUNTER — Other Ambulatory Visit: Payer: Self-pay

## 2022-11-29 VITALS — BP 140/80 | HR 91 | Temp 98.1°F | Ht 69.0 in | Wt 303.7 lb

## 2022-11-29 DIAGNOSIS — I10 Essential (primary) hypertension: Secondary | ICD-10-CM | POA: Diagnosis not present

## 2022-11-29 DIAGNOSIS — F17210 Nicotine dependence, cigarettes, uncomplicated: Secondary | ICD-10-CM | POA: Diagnosis not present

## 2022-11-29 DIAGNOSIS — H3412 Central retinal artery occlusion, left eye: Secondary | ICD-10-CM | POA: Diagnosis not present

## 2022-11-29 DIAGNOSIS — Z720 Tobacco use: Secondary | ICD-10-CM

## 2022-11-29 MED ORDER — ASPIRIN 81 MG PO TBEC: 81.0000 mg | DELAYED_RELEASE_TABLET | Freq: Every day | ORAL | 3 refills | Status: AC

## 2022-11-29 NOTE — Patient Instructions (Addendum)
Thank you so much for coming to the clinic today!   I am sorry about your blurry vision, we are trying to obtain the records from Dr. Allyne Gee office.  Once we do, I will give you call to follow-up on.  In the meanwhile, we are going to start you on the aspirin and start by checking your lipids.  The best thing we can do right now is a risk modify, so controlling your blood pressure as well as stopping smoking could really help.  If you have any questions please feel free to the call the clinic at anytime at [phone]. It was a pleasure seeing you!  Best, Dr. Thomasene Ripple

## 2022-11-30 DIAGNOSIS — H3412 Central retinal artery occlusion, left eye: Secondary | ICD-10-CM | POA: Insufficient documentation

## 2022-11-30 NOTE — Assessment & Plan Note (Addendum)
Patient presents after follow-up from his ophthalmologist.  He states that for the last 2 weeks his vision was blurry, and he got to the point where he could not see out of his left eye.  On examination by ophthalmology, they noted a plaque in the retinal artery, and came to the diagnosis of central retinal artery occlusion of the left eye.  I have ordered a carotid Doppler ultrasound, MRI brain, and echocardiogram to further evaluate etiology of this.  Patient does have significant vision impairment of the left eye on examination.  He also has multiple risk factors such as tobacco use, hypertension, and morbid obesity (discussed below).  Plan: - Aspirin daily - Echo, MR Angio Head, ultrasounds of carotid arteries pending

## 2022-11-30 NOTE — Assessment & Plan Note (Signed)
Blood pressure much better controlled from last visit, at 140/80.  Current regimen is amlodipine-olmesartan 10-20 mg, as well as hydrochlorothiazide 12.5 mg.  Testing done at last visit for secondary hypertension was negative.  Will continue with current regimen, can consider increase of hydrochlorothiazide to 25 if necessary.  Will likely need BMP at next visit to to evaluate kidney function.  Plan: - Continue amlodipine-olmesartan 10-20 - Hydrochlorothiazide 12.5 mg

## 2022-11-30 NOTE — Addendum Note (Signed)
Addended by: Olegario Messier on: 11/30/2022 03:00 PM   Modules accepted: Orders

## 2022-11-30 NOTE — Progress Notes (Addendum)
CC: Blurriness of left eye  HPI:  [name] is a 37 y.o. male living with a history stated below and presents today for redness of left eye. Please see problem based assessment and plan for additional details.  Past Medical History:  Diagnosis Date   Hypertension    Tobacco use disorder     Current Outpatient Medications on File Prior to Visit  Medication Sig Dispense Refill   amlodipine-olmesartan (AZOR) 10-20 MG tablet Take 1 tablet by mouth daily. 90 tablet 3   hydrochlorothiazide (MICROZIDE) 12.5 MG capsule Take 1 capsule (12.5 mg total) by mouth daily. 30 capsule 2   varenicline (CHANTIX) 1 MG tablet TAKE 1 TABLET(1 MG) BY MOUTH TWICE DAILY 180 tablet 0   No current facility-administered medications on file prior to visit.    Family History  Problem Relation Age of Onset   Lung cancer Father    Hyperlipidemia Father    Cleft palate Brother     Social History   Socioeconomic History   Marital status: Single    Spouse name: Not on file   Number of children: Not on file   Years of education: Not on file   Highest education level: Not on file  Occupational History   Occupation: Production designer, theatre/television/film    Comment: produce company  Tobacco Use   Smoking status: Every Day    Current packs/day: 0.50    Average packs/day: 0.5 packs/day for 10.0 years (5.0 ttl pk-yrs)    Types: Cigarettes   Smokeless tobacco: Never   Tobacco comments:    =.5 pkpd  Vaping Use   Vaping status: Never Used  Substance and Sexual Activity   Alcohol use: Not Currently    Alcohol/week: 5.0 standard drinks of alcohol    Types: 5 Shots of liquor per week    Comment: weekly   Drug use: Not Currently    Types: Marijuana   Sexual activity: Yes  Other Topics Concern   Not on file  Social History Narrative   Not on file   Social Determinants of Health   Financial Resource Strain: Not on file  Food Insecurity: No Food Insecurity (10/25/2022)   Hunger Vital Sign    Worried About Running Out of  Food in the Last Year: Never true    Ran Out of Food in the Last Year: Never true  Transportation Needs: No Transportation Needs (10/25/2022)   PRAPARE - Administrator, Civil Service (Medical): No    Lack of Transportation (Non-Medical): No  Physical Activity: Not on file  Stress: Not on file  Social Connections: Moderately Isolated (10/25/2022)   Social Connection and Isolation Panel [NHANES]    Frequency of Communication with Friends and Family: More than three times a week    Frequency of Social Gatherings with Friends and Family: More than three times a week    Attends Religious Services: Never    Database administrator or Organizations: No    Attends Banker Meetings: Never    Marital Status: Married  Catering manager Violence: Not At Risk (10/25/2022)   Humiliation, Afraid, Rape, and Kick questionnaire    Fear of Current or Ex-Partner: No    Emotionally Abused: No    Physically Abused: No    Sexually Abused: No    Review of Systems: ROS negative except for what is noted on the assessment and plan.  Vitals:   11/29/22 0848  BP: (!) 140/80  Pulse: 91  Temp: 98.1 F (  36.7 C)  TempSrc: Oral  SpO2: 99%  Weight: (!) 303 lb 11.2 oz (137.8 kg)  Height: 5\' 9"  (1.753 m)    Physical Exam: Constitutional: Obese male, in no acute distress HENT: normocephalic atraumatic, mucous membranes moist Eyes: conjunctiva non-erythematous, patient unable to read eye chart when held 1 foot in front of him out of the left eye. Neck: supple Cardiovascular: regular rate and rhythm, no m/r/g, no bruits heard on auscultation of carotid arteries Pulmonary/Chest: normal work of breathing on room air, lungs clear to auscultation bilaterally MSK: normal bulk and tone Neurological: alert & oriented x 3, 5/5 strength in bilateral upper and lower extremities, normal gait, normal sensation in all extremities.  Cranial nerves II through XII intact   Assessment & Plan:    Central retinal artery occlusion of left eye Patient presents after follow-up from his ophthalmologist.  He states that for the last 2 weeks his vision was blurry, and he got to the point where he could not see out of his left eye.  On examination by ophthalmology, they noted a plaque in the retinal artery, and came to the diagnosis of central retinal artery occlusion of the left eye.  I have ordered a carotid Doppler ultrasound, MRI brain, and echocardiogram to further evaluate etiology of this.  Patient does have significant vision impairment of the left eye on examination.  He also has multiple risk factors such as tobacco use, hypertension, and morbid obesity (discussed below).  Plan: - Aspirin daily - Echo, MR Angio Head, ultrasounds of carotid arteries pending  Hypertension Blood pressure much better controlled from last visit, at 140/80.  Current regimen is amlodipine-olmesartan 10-20 mg, as well as hydrochlorothiazide 12.5 mg.  Testing done at last visit for secondary hypertension was negative.  Will continue with current regimen, can consider increase of hydrochlorothiazide to 25 if necessary.  Will likely need BMP at next visit to to evaluate kidney function.  Plan: - Continue amlodipine-olmesartan 10-20 - Hydrochlorothiazide 12.5 mg  Tobacco abuse He is very amenable to quitting, and has not been taking the Chantix as of yet.  Explained to him that with central retinal artery artery occlusion, risk modification includes including smoking.  He is currently at half a pack a day, decreased from 1 pack a day.  He is attempting to wean himself off.  I offered him nicotine patches, which she stated that he would not prefer at this time.  Patient discussed with Dr. Maryagnes Amos Marylu Dudenhoeffer, M.D. [facility], PGY-2 Pager: [phone] Date 11/30/2022 Time 3:00 PM

## 2022-11-30 NOTE — Progress Notes (Signed)
 Internal Medicine Clinic Attending  Case discussed with the resident physician at the time of the visit.  We reviewed the patient's history, exam, and pertinent patient test results.  I agree with the assessment, diagnosis, and plan of care documented in the resident's note.

## 2022-11-30 NOTE — Assessment & Plan Note (Signed)
He is very amenable to quitting, and has not been taking the Chantix as of yet.  Explained to him that with central retinal artery artery occlusion, risk modification includes including smoking.  He is currently at half a pack a day, decreased from 1 pack a day.  He is attempting to wean himself off.  I offered him nicotine patches, which she stated that he would not prefer at this time.

## 2022-12-05 ENCOUNTER — Ambulatory Visit (HOSPITAL_COMMUNITY)
Admission: RE | Admit: 2022-12-05 | Discharge: 2022-12-05 | Disposition: A | Discharge status: Home / Self Care | Payer: 59 | Source: Ambulatory Visit | Attending: Student in an Organized Health Care Education/Training Program | Admitting: Student in an Organized Health Care Education/Training Program

## 2022-12-05 DIAGNOSIS — H3412 Central retinal artery occlusion, left eye: Secondary | ICD-10-CM | POA: Diagnosis present

## 2022-12-10 ENCOUNTER — Other Ambulatory Visit: Payer: 59

## 2023-01-14 ENCOUNTER — Other Ambulatory Visit: Payer: Self-pay | Admitting: Student

## 2023-01-15 ENCOUNTER — Ambulatory Visit (HOSPITAL_COMMUNITY)
Admission: RE | Admit: 2023-01-15 | Discharge: 2023-01-15 | Disposition: A | Discharge status: Home / Self Care | Payer: 59 | Source: Ambulatory Visit | Attending: Student in an Organized Health Care Education/Training Program

## 2023-01-15 DIAGNOSIS — I10 Essential (primary) hypertension: Secondary | ICD-10-CM | POA: Diagnosis not present

## 2023-01-15 DIAGNOSIS — H3412 Central retinal artery occlusion, left eye: Secondary | ICD-10-CM | POA: Insufficient documentation

## 2023-01-15 DIAGNOSIS — F17210 Nicotine dependence, cigarettes, uncomplicated: Secondary | ICD-10-CM | POA: Diagnosis not present

## 2023-01-15 LAB — ECHOCARDIOGRAM COMPLETE
AR max vel: 2.55 cm2
AV Area VTI: 2.37 cm2
AV Area mean vel: 2.49 cm2
AV Mean grad: 7 mm[Hg]
AV Peak grad: 10.6 mm[Hg]
Ao pk vel: 1.63 m/s
Area-P 1/2: 4.29 cm2
S' Lateral: 3.5 cm

## 2023-01-31 NOTE — Addendum Note (Signed)
Addended by: Lucille Passy on: 01/31/2023 04:13 PM   Modules accepted: Orders

## 2023-03-21 ENCOUNTER — Ambulatory Visit (INDEPENDENT_AMBULATORY_CARE_PROVIDER_SITE_OTHER): Payer: 59 | Admitting: Internal Medicine

## 2023-03-21 ENCOUNTER — Encounter: Payer: Self-pay | Admitting: Internal Medicine

## 2023-03-21 VITALS — BP 147/81 | HR 98 | Temp 98.0°F | Ht 69.0 in | Wt 322.8 lb

## 2023-03-21 DIAGNOSIS — F1090 Alcohol use, unspecified, uncomplicated: Secondary | ICD-10-CM

## 2023-03-21 DIAGNOSIS — I10 Essential (primary) hypertension: Secondary | ICD-10-CM | POA: Diagnosis not present

## 2023-03-21 DIAGNOSIS — G4733 Obstructive sleep apnea (adult) (pediatric): Secondary | ICD-10-CM

## 2023-03-21 DIAGNOSIS — H6593 Unspecified nonsuppurative otitis media, bilateral: Secondary | ICD-10-CM

## 2023-03-21 DIAGNOSIS — F17210 Nicotine dependence, cigarettes, uncomplicated: Secondary | ICD-10-CM

## 2023-03-21 DIAGNOSIS — H3412 Central retinal artery occlusion, left eye: Secondary | ICD-10-CM | POA: Diagnosis not present

## 2023-03-21 DIAGNOSIS — Z0000 Encounter for general adult medical examination without abnormal findings: Secondary | ICD-10-CM

## 2023-03-21 DIAGNOSIS — Z720 Tobacco use: Secondary | ICD-10-CM

## 2023-03-21 MED ORDER — ROSUVASTATIN CALCIUM 20 MG PO TABS: 20.0000 mg | ORAL_TABLET | Freq: Every day | ORAL | 11 refills | Status: DC

## 2023-03-21 NOTE — Patient Instructions (Addendum)
JoelJoel Li, it was a pleasure seeing you today! You endorsed feeling well today. Below are some of the things we talked about this visit. We look forward to seeing you in the follow up appointment!  Today we discussed: We will increase your blood pressure medicine called Azor to 10-40 mg. We will check some lab work today.   We will start you on Crestor 20 mg daily. I will try to get the MRA done this week.   For Chantix Take half a tablet daily for the next 3 days. Then take half a tablet twice daily for 4 days. After this take 1 tablet twice daily. Your quit date is 04/03/2023.  Use your Bipap every night for at least 5 hours.   I have ordered the following labs today:  Lab Orders         BMP8+Anion Gap         Lipid Profile        Referrals ordered today:   Referral Orders  No referral(s) requested today     I have ordered the following medication/changed the following medications:   Stop the following medications: There are no discontinued medications.   Start the following medications: Meds ordered this encounter  Medications   rosuvastatin (CRESTOR) 20 MG tablet    Sig: Take 1 tablet (20 mg total) by mouth daily.    Dispense:  30 tablet    Refill:  11     Follow-up: 1 month follow up   Please make sure to arrive 15 minutes prior to your next appointment. If you arrive late, you may be asked to reschedule.   We look forward to seeing you next time. Please call our clinic at [phone] if you have any questions or concerns. The best time to call is Monday-Friday from 9am-4pm, but there is someone available 24/7. If after hours or the weekend, call the main hospital number and ask for the Internal Medicine Resident On-Call. If you need medication refills, please notify your pharmacy one week in advance and they will send [place] a request.  Thank you for letting us take part in your care. Wishing you the best!  Thank you, Gwenevere Abbot, MD

## 2023-03-21 NOTE — Assessment & Plan Note (Signed)
Pt to get TDAP shot from the pharmacy.

## 2023-03-21 NOTE — Assessment & Plan Note (Signed)
Pt has HTN, his hypertension is uncontrolled. He has OSA that is suboptimally treated so will encourage better adherence with his Bipap. Pt currently on Amlodipine-olmesartan 10-20 mg and hydrochlorothiazide 12.5 mg every day.  Given pt is reporting elevated home readings will increase his antihypertensive to Azor 10-40 mg daily.

## 2023-03-21 NOTE — Addendum Note (Signed)
Addended by: Gwenevere Abbot on: 03/21/2023 10:20 AM   Modules accepted: Orders

## 2023-03-21 NOTE — Progress Notes (Signed)
CC: follow up  HPI:  Joel Li is a 37 y.o. with medical history of HTN, tobacco use disorder, and CRAO presenting to [facility] for a follow up. Last appointment was 11/2022 and pt was advised to follow up in 1 month to further discuss his vision problems. Multiple studies were ordered at that time including MRA of head, echo, and carotid ultrasound. MRA head still pending but echo and carotid ultrasound without significant abnormality.   Please see problem-based list for further details, assessments, and plans.  Past Medical History:  Diagnosis Date   Acute kidney injury (HCC) 10/25/2022   Excessive daytime sleepiness 12/14/2021   Fluid level behind tympanic membrane of left ear 10/06/2021   History of paranasal sinus congestion 12/14/2021   Hypersomnia with sleep apnea 12/14/2021   Hypertension    Nocturia more than twice per night 12/14/2021   Pancreatitis 03/02/2021   Tobacco use disorder    Vaccine counseling 04/05/2020     Current Outpatient Medications (Cardiovascular):    rosuvastatin (CRESTOR) 20 MG tablet, Take 1 tablet (20 mg total) by mouth daily.   amlodipine-olmesartan (AZOR) 10-20 MG tablet, Take 1 tablet by mouth daily.   hydrochlorothiazide (MICROZIDE) 12.5 MG capsule, TAKE 1 CAPSULE(12.5 MG) BY MOUTH DAILY   Current Outpatient Medications (Analgesics):    aspirin EC 81 MG tablet, Take 1 tablet (81 mg total) by mouth daily. Swallow whole.   Current Outpatient Medications (Other):    varenicline (CHANTIX) 1 MG tablet, TAKE 1 TABLET(1 MG) BY MOUTH TWICE DAILY  Review of Systems:  Review of system negative unless stated in the problem list or HPI.    Physical Exam:  Vitals:   03/21/23 0901  BP: (!) 147/81  Pulse: 98  Temp: 98 F (36.7 C)  TempSrc: Oral  SpO2: 99%  Weight: (!) 322 lb 12.8 oz (146.4 kg)  Height: 5\' 9"  (1.753 m)   Physical Exam General: NAD HENT: NCAT, vision 20/20 on right side and 20/100 on left but blurry.  Lungs: CTAB, no  wheeze, rhonchi or rales.  Cardiovascular: Normal heart sounds, no r/m/g, 2+ pulses in all extremities. No LE edema Abdomen: No TTP, normal bowel sounds MSK: No asymmetry or muscle atrophy.  Skin: no lesions noted on exposed skin Neuro: Alert and oriented x4. CN grossly intact Psych: Normal mood and normal affect   Assessment & Plan:   Fluid level behind tympanic membrane of both ears Pt follows with ENT. Last visit 05/2022.  Central retinal artery occlusion of left eye Pt reports poor vision in the left eye. States he followed with ophthalmologist who told him vision recovery will be minimal. He reports some improvement. Advised to continue following up with them. Next follow up in 6 months. Studies ordered previously included echocardiogram, carotid ultrasound, and MRA of head. Echo overall unremarkable except LVH and carotid ultrasound without significant stenosis. MRI and MRA Head are now scheduled for tomorrow. Will start pt on high intensity statin given CRAO and have him continue aspirin indefinitely. Lipid panel ordered.   Alcohol use disorder Pt has decreased his alcohol use to drinking a fifth over a 3 weeks period which is significant reduction from daily. Congratulated pt on his efforts.  Hypertension Pt has HTN, his hypertension is uncontrolled. He has OSA that is suboptimally treated so will encourage better adherence with his Bipap. Pt currently on Amlodipine-olmesartan 10-20 mg and hydrochlorothiazide 12.5 mg every day.  Given pt is reporting elevated home readings will increase his antihypertensive to Azor 10-40  mg daily.   Tobacco abuse Discussed thoroughly with patient. Pt agreeable to select 04/03/23 as his quit date. He is to start chantix today. Dosing information given to the patient. Pt request nicotine gum for as needed use. Will send those in. Plan to address at next visit.   OSA (obstructive sleep apnea) Pt with severe OSA who is complaint with his Bipap only 50  % of the time. He reports using it for 7/14 days and around 4-5 hours nightly. Advised to use it every night and >5 hours per night. Discussed the dangers of untreated OSA including heart failure and pt reports increasing his Bipap use. Advised his BP and weight will improve with Bipap use as well.   Healthcare maintenance Pt to get TDAP shot from the pharmacy.    See Encounters Tab for problem based charting.  Patient Discussed with Dr. Rubie Maid, MD Eligha Bridegroom. [facility], PGY-3

## 2023-03-21 NOTE — Assessment & Plan Note (Signed)
Pt with severe OSA who is complaint with his Bipap only 50 % of the time. He reports using it for 7/14 days and around 4-5 hours nightly. Advised to use it every night and >5 hours per night. Discussed the dangers of untreated OSA including heart failure and pt reports increasing his Bipap use. Advised his BP and weight will improve with Bipap use as well.

## 2023-03-21 NOTE — Assessment & Plan Note (Signed)
Pt has decreased his alcohol use to drinking a fifth over a 3 weeks period which is significant reduction from daily. Congratulated pt on his efforts.

## 2023-03-21 NOTE — Assessment & Plan Note (Addendum)
Pt follows with ENT. Last visit 05/2022.

## 2023-03-21 NOTE — Assessment & Plan Note (Signed)
Pt reports poor vision in the left eye. States he followed with ophthalmologist who told him vision recovery will be minimal. He reports some improvement. Advised to continue following up with them. Next follow up in 6 months. Studies ordered previously included echocardiogram, carotid ultrasound, and MRA of head. Echo overall unremarkable except LVH and carotid ultrasound without significant stenosis. MRI and MRA Head are now scheduled for tomorrow. Will start pt on high intensity statin given CRAO and have him continue aspirin indefinitely. Lipid panel ordered.

## 2023-03-21 NOTE — Assessment & Plan Note (Signed)
Discussed thoroughly with patient. Pt agreeable to select 04/03/23 as his quit date. He is to start chantix today. Dosing information given to the patient. Pt request nicotine gum for as needed use. Will send those in. Plan to address at next visit.

## 2023-03-22 ENCOUNTER — Ambulatory Visit (HOSPITAL_COMMUNITY)
Admission: RE | Admit: 2023-03-22 | Discharge: 2023-03-22 | Disposition: A | Discharge status: Home / Self Care | Payer: 59 | Source: Ambulatory Visit | Attending: Internal Medicine | Admitting: Internal Medicine

## 2023-03-22 ENCOUNTER — Ambulatory Visit (HOSPITAL_COMMUNITY)
Admission: RE | Admit: 2023-03-22 | Discharge: 2023-03-22 | Disposition: A | Discharge status: Home / Self Care | Payer: 59 | Source: Ambulatory Visit | Attending: Student in an Organized Health Care Education/Training Program | Admitting: Student in an Organized Health Care Education/Training Program

## 2023-03-22 DIAGNOSIS — H3412 Central retinal artery occlusion, left eye: Secondary | ICD-10-CM | POA: Diagnosis present

## 2023-03-22 LAB — LIPID PANEL
Chol/HDL Ratio: 4.4 {ratio} (ref 0.0–5.0)
Cholesterol, Total: 210 mg/dL — ABNORMAL HIGH (ref 100–199)
HDL: 48 mg/dL (ref >39)
LDL Chol Calc (NIH): 133 mg/dL — ABNORMAL HIGH (ref 0–99)
Triglycerides: 160 mg/dL — ABNORMAL HIGH (ref 0–149)
VLDL Cholesterol Cal: 29 mg/dL (ref 5–40)

## 2023-03-22 LAB — BMP8+ANION GAP
Anion Gap: 16 mmol/L (ref 10.0–18.0)
BUN/Creatinine Ratio: 12 (ref 9–20)
BUN: 13 mg/dL (ref 6–20)
CO2: 20 mmol/L (ref 20–29)
Calcium: 9.2 mg/dL (ref 8.7–10.2)
Chloride: 100 mmol/L (ref 96–106)
Creatinine, Ser: 1.08 mg/dL (ref 0.76–1.27)
Glucose: 111 mg/dL — ABNORMAL HIGH (ref 70–99)
Potassium: 3.9 mmol/L (ref 3.5–5.2)
Sodium: 136 mmol/L (ref 134–144)
eGFR: 90 mL/min/{1.73_m2} (ref >59)

## 2023-03-22 NOTE — Progress Notes (Signed)
Internal Medicine Clinic Attending  Case discussed with the resident at the time of the visit.  We reviewed the resident's history and exam and pertinent patient test results.  I agree with the assessment, diagnosis, and plan of care documented in the resident's note.  

## 2023-08-07 ENCOUNTER — Emergency Department (HOSPITAL_COMMUNITY)

## 2023-08-07 ENCOUNTER — Emergency Department (HOSPITAL_COMMUNITY)
Admission: EM | Admit: 2023-08-07 | Discharge: 2023-08-07 | Disposition: A | Discharge status: Home / Self Care | Attending: Emergency Medicine | Admitting: Emergency Medicine

## 2023-08-07 ENCOUNTER — Other Ambulatory Visit: Payer: Self-pay

## 2023-08-07 DIAGNOSIS — D72829 Elevated white blood cell count, unspecified: Secondary | ICD-10-CM | POA: Diagnosis not present

## 2023-08-07 DIAGNOSIS — R109 Unspecified abdominal pain: Secondary | ICD-10-CM | POA: Diagnosis present

## 2023-08-07 DIAGNOSIS — Z7982 Long term (current) use of aspirin: Secondary | ICD-10-CM | POA: Insufficient documentation

## 2023-08-07 DIAGNOSIS — K852 Alcohol induced acute pancreatitis without necrosis or infection: Secondary | ICD-10-CM | POA: Diagnosis not present

## 2023-08-07 DIAGNOSIS — I160 Hypertensive urgency: Secondary | ICD-10-CM | POA: Insufficient documentation

## 2023-08-07 LAB — CBC WITH DIFFERENTIAL/PLATELET
Abs Immature Granulocytes: 0.1 10*3/uL — ABNORMAL HIGH (ref 0.00–0.07)
Basophils Absolute: 0 10*3/uL (ref 0.0–0.1)
Basophils Relative: 0 %
Eosinophils Absolute: 0 10*3/uL (ref 0.0–0.5)
Eosinophils Relative: 0 %
HCT: 43.2 % (ref 39.0–52.0)
Hemoglobin: 15 g/dL (ref 13.0–17.0)
Immature Granulocytes: 1 %
Lymphocytes Relative: 4 %
Lymphs Abs: 0.6 10*3/uL — ABNORMAL LOW (ref 0.7–4.0)
MCH: 31 pg (ref 26.0–34.0)
MCHC: 34.7 g/dL (ref 30.0–36.0)
MCV: 89.3 fL (ref 80.0–100.0)
Monocytes Absolute: 0.4 10*3/uL (ref 0.1–1.0)
Monocytes Relative: 3 %
Neutro Abs: 14.9 10*3/uL — ABNORMAL HIGH (ref 1.7–7.7)
Neutrophils Relative %: 92 %
Platelets: 256 10*3/uL (ref 150–400)
RBC: 4.84 MIL/uL (ref 4.22–5.81)
RDW: 12.7 % (ref 11.5–15.5)
WBC: 16.1 10*3/uL — ABNORMAL HIGH (ref 4.0–10.5)
nRBC: 0 % (ref 0.0–0.2)

## 2023-08-07 LAB — COMPREHENSIVE METABOLIC PANEL WITH GFR
ALT: 32 U/L (ref 0–44)
AST: 24 U/L (ref 15–41)
Albumin: 4.2 g/dL (ref 3.5–5.0)
Alkaline Phosphatase: 72 U/L (ref 38–126)
Anion gap: 11 (ref 5–15)
BUN: 16 mg/dL (ref 6–20)
CO2: 24 mmol/L (ref 22–32)
Calcium: 9.3 mg/dL (ref 8.9–10.3)
Chloride: 100 mmol/L (ref 98–111)
Creatinine, Ser: 1.12 mg/dL (ref 0.61–1.24)
GFR, Estimated: >60 mL/min (ref >60)
Glucose, Bld: 167 mg/dL — ABNORMAL HIGH (ref 70–99)
Potassium: 3.4 mmol/L — ABNORMAL LOW (ref 3.5–5.1)
Sodium: 135 mmol/L (ref 135–145)
Total Bilirubin: 0.4 mg/dL (ref 0.0–1.2)
Total Protein: 8.3 g/dL — ABNORMAL HIGH (ref 6.5–8.1)

## 2023-08-07 LAB — URINALYSIS, W/ REFLEX TO CULTURE (INFECTION SUSPECTED)
Bacteria, UA: NONE SEEN
Bilirubin Urine: NEGATIVE
Glucose, UA: NEGATIVE mg/dL
Hgb urine dipstick: NEGATIVE
Ketones, ur: NEGATIVE mg/dL
Leukocytes,Ua: NEGATIVE
Nitrite: NEGATIVE
Protein, ur: >=300 mg/dL — AB
Specific Gravity, Urine: 1.018 (ref 1.005–1.030)
pH: 6 (ref 5.0–8.0)

## 2023-08-07 LAB — ETHANOL: Alcohol, Ethyl (B): <15 mg/dL (ref <15)

## 2023-08-07 LAB — LIPASE, BLOOD: Lipase: 207 U/L — ABNORMAL HIGH (ref 11–51)

## 2023-08-07 MED ORDER — IOHEXOL 350 MG/ML SOLN
100.0000 mL | Freq: Once | INTRAVENOUS | Status: AC | PRN
  Administered 2023-08-07: 100 mL via INTRAVENOUS

## 2023-08-07 MED ORDER — HYDROMORPHONE HCL 1 MG/ML IJ SOLN
1.0000 mg | Freq: Once | INTRAMUSCULAR | Status: AC
  Administered 2023-08-07: 1 mg via INTRAVENOUS
  Filled 2023-08-07: qty 1

## 2023-08-07 MED ORDER — HYDROCHLOROTHIAZIDE 12.5 MG PO TABS
12.5000 mg | ORAL_TABLET | Freq: Once | ORAL | Status: AC
  Administered 2023-08-07: 12.5 mg via ORAL
  Filled 2023-08-07: qty 1

## 2023-08-07 MED ORDER — MORPHINE SULFATE (PF) 4 MG/ML IV SOLN
4.0000 mg | Freq: Once | INTRAVENOUS | Status: AC
  Administered 2023-08-07: 4 mg via INTRAVENOUS
  Filled 2023-08-07: qty 1

## 2023-08-07 MED ORDER — IRBESARTAN 150 MG PO TABS
150.0000 mg | ORAL_TABLET | Freq: Every day | ORAL | Status: DC
  Administered 2023-08-07: 150 mg via ORAL
  Filled 2023-08-07: qty 1

## 2023-08-07 MED ORDER — OXYCODONE-ACETAMINOPHEN 5-325 MG PO TABS
1.0000 | ORAL_TABLET | Freq: Four times a day (QID) | ORAL | 0 refills | Status: DC | PRN
Start: 2023-08-07 — End: 2024-02-20

## 2023-08-07 MED ORDER — AMLODIPINE BESYLATE 5 MG PO TABS
10.0000 mg | ORAL_TABLET | Freq: Every day | ORAL | Status: DC
  Administered 2023-08-07: 10 mg via ORAL
  Filled 2023-08-07: qty 2

## 2023-08-07 MED ORDER — ONDANSETRON 4 MG PO TBDP: 4.0000 mg | ORAL_TABLET | Freq: Three times a day (TID) | ORAL | 0 refills | Status: DC | PRN

## 2023-08-07 MED ORDER — AMLODIPINE-OLMESARTAN 10-20 MG PO TABS: 1.0000 | ORAL_TABLET | Freq: Every day | ORAL | Status: DC

## 2023-08-07 MED ORDER — ONDANSETRON HCL 4 MG/2ML IJ SOLN
4.0000 mg | Freq: Once | INTRAMUSCULAR | Status: AC
  Administered 2023-08-07: 4 mg via INTRAVENOUS
  Filled 2023-08-07: qty 2

## 2023-08-07 MED ORDER — HYDRALAZINE HCL 25 MG PO TABS
25.0000 mg | ORAL_TABLET | Freq: Once | ORAL | Status: AC
  Administered 2023-08-07: 25 mg via ORAL
  Filled 2023-08-07: qty 1

## 2023-08-07 NOTE — ED Provider Notes (Cosign Needed Addendum)
 [place] [facility]MENT AT [facility] Provider Note   CSN: [id number] Arrival date & time: 08/07/23  1619    History  Chief Complaint  Patient presents with   Abdominal Pain    Joel Li is a 38 y.o. male here for evaluation of abd pain, radiates into mid central back. Started earlier today while at work. Etoh drinker, about every other day. No hx of DT, WD seizure. Some nausea wo emesis. Loose stool today without blood. No CP, SOB, fever, LE edema. No PND, orthopnea.  Occasionally takes NSAIDs.  Had some he previously diagnosed with EtOH pancreatitis.  Has had significantly elevated blood pressures today greater than 235 systolic.  He has not taken his blood pressure medication over the last 2 days.  No headache numbness or weakness      HPI     Home Medications Prior to Admission medications   Medication Sig Start Date End Date Taking? Authorizing Provider  ondansetron (ZOFRAN-ODT) 4 MG disintegrating tablet Take 1 tablet (4 mg total) by mouth every 8 (eight) hours as needed. 08/07/23  Yes Nafisah Runions A, PA-C  oxyCODONE -acetaminophen  (PERCOCET/ROXICET) 5-325 MG tablet Take 1 tablet by mouth every 6 (six) hours as needed for severe pain (pain score 7-10). 08/07/23  Yes Lanea Vankirk A, PA-C  amlodipine -olmesartan  (AZOR ) 10-20 MG tablet Take 1 tablet by mouth daily. 10/15/22   Dorenda Gandy, MD  aspirin  EC 81 MG tablet Take 1 tablet (81 mg total) by mouth daily. Swallow whole. 11/29/22 11/24/23  Nooruddin, Saad, MD  hydrochlorothiazide  (MICROZIDE ) 12.5 MG capsule TAKE 1 CAPSULE(12.5 MG) BY MOUTH DAILY 01/14/23   Sheree Dieter, MD  rosuvastatin  (CRESTOR ) 20 MG tablet Take 1 tablet (20 mg total) by mouth daily. 03/21/23 03/20/24  Jackolyn Masker, MD  varenicline  (CHANTIX ) 1 MG tablet TAKE 1 TABLET(1 MG) BY MOUTH TWICE DAILY 10/25/22   Sheree Dieter, MD      Allergies    Penicillins    Review of Systems   Review of Systems  Constitutional:  Negative.   HENT: Negative.    Respiratory: Negative.    Cardiovascular: Negative.   Gastrointestinal:  Positive for abdominal pain, diarrhea and nausea. Negative for abdominal distention, anal bleeding, blood in stool, constipation and vomiting.  Genitourinary: Negative.   Musculoskeletal: Negative.   Skin: Negative.   Neurological: Negative.   All other systems reviewed and are negative.   Physical Exam Updated Vital Signs BP (!) 199/108   Pulse 93   Temp 98.5 F (36.9 C) (Oral)   Resp 18   SpO2 97%  Physical Exam Vitals and nursing note reviewed.  Constitutional:      General: He is not in acute distress.    Appearance: He is well-developed. He is not ill-appearing or diaphoretic.  HENT:     Head: Normocephalic and atraumatic.  Eyes:     Pupils: Pupils are equal, round, and reactive to light.  Cardiovascular:     Rate and Rhythm: Normal rate and regular rhythm.     Pulses: Normal pulses.          Radial pulses are 2+ on the right side and 2+ on the left side.       Dorsalis pedis pulses are 2+ on the right side and 2+ on the left side.     Heart sounds: Normal heart sounds.  Pulmonary:     Effort: Pulmonary effort is normal. No respiratory distress.     Breath sounds: Normal breath sounds and air entry.  Comments: Clear bilaterally, speaks in full sentences without difficulty Chest:     Comments: Nontender Abdominal:     General: Bowel sounds are normal. There is no distension.     Palpations: Abdomen is soft.     Tenderness: There is abdominal tenderness in the epigastric area. There is no right CVA tenderness, left CVA tenderness, guarding or rebound. Negative signs include Murphy's sign.     Hernia: No hernia is present.     Comments: Tenderness epigastric region without rebound or guarding, negative Murphy sign  Musculoskeletal:        General: Normal range of motion.     Cervical back: Normal range of motion and neck supple.     Comments: No bony  tenderness, compartments soft  Skin:    General: Skin is warm and dry.     Capillary Refill: Capillary refill takes less than 2 seconds.     Comments: No edema.  Erythema anterior bilateral forearms consistent with sunburn.   Neurological:     General: No focal deficit present.     Mental Status: He is alert and oriented to person, place, and time.     Cranial Nerves: Cranial nerves 2-12 are intact.     Sensory: Sensation is intact.     Motor: Motor function is intact.     Coordination: Coordination is intact.     Gait: Gait is intact.     ED Results / Procedures / Treatments   Labs (all labs ordered are listed, but only abnormal results are displayed) Labs Reviewed  CBC WITH DIFFERENTIAL/PLATELET - Abnormal; Notable for the following components:      Result Value   WBC 16.1 (*)    Neutro Abs 14.9 (*)    Lymphs Abs 0.6 (*)    Abs Immature Granulocytes 0.10 (*)    All other components within normal limits  COMPREHENSIVE METABOLIC PANEL WITH GFR - Abnormal; Notable for the following components:   Potassium 3.4 (*)    Glucose, Bld 167 (*)    Total Protein 8.3 (*)    All other components within normal limits  LIPASE, BLOOD - Abnormal; Notable for the following components:   Lipase 207 (*)    All other components within normal limits  URINALYSIS, W/ REFLEX TO CULTURE (INFECTION SUSPECTED) - Abnormal; Notable for the following components:   Protein, ur >=300 (*)    All other components within normal limits  ETHANOL    EKG None  Radiology CT Angio Chest/Abd/Pel for Dissection W and/or Wo Contrast Result Date: 08/07/2023 CLINICAL DATA:  Epigastric pain for several hours radiating to the back, initial encounter EXAM: CT ANGIOGRAPHY CHEST, ABDOMEN AND PELVIS TECHNIQUE: Non-contrast CT of the chest was initially obtained. Multidetector CT imaging through the chest, abdomen and pelvis was performed using the standard protocol during bolus administration of intravenous contrast.  Multiplanar reconstructed images and MIPs were obtained and reviewed to evaluate the vascular anatomy. RADIATION DOSE REDUCTION: This exam was performed according to the departmental dose-optimization program which includes automated exposure control, adjustment of the mA and/or kV according to patient size and/or use of iterative reconstruction technique. CONTRAST:  OMNIPAQUE  IOHEXOL  350 MG/ML SOLN COMPARISON:  10/15/2022 FINDINGS: CTA CHEST FINDINGS Cardiovascular: Initial precontrast images show no hyperdense crescent to suggest acute aortic injury. Post-contrast images demonstrate the thoracic aorta to be within normal limits. No aneurysmal dilatation or dissection is noted. Normal branching vessels are noted. The heart is not significantly enlarged. Pulmonary artery shows no evidence of  pulmonary embolism. Mediastinum/Nodes: Thoracic inlet is within normal limits. Esophagus as visualized is unremarkable. No hilar or mediastinal adenopathy is noted. Lungs/Pleura: The lungs are well aerated bilaterally. No focal infiltrate or sizable effusion is noted. No parenchymal nodule is seen. Musculoskeletal: Degenerative changes of the thoracic spine are noted. No acute rib abnormality is seen. Review of the MIP images confirms the above findings. CTA ABDOMEN AND PELVIS FINDINGS VASCULAR Aorta: Abdominal aorta demonstrates no aneurysmal dilatation or dissection. Celiac: Patent without evidence of aneurysm, dissection, vasculitis or significant stenosis. SMA: Patent without evidence of aneurysm, dissection, vasculitis or significant stenosis. Renals: Dual renal arteries are noted on the right. Single renal artery on the left. No stenosis is seen. IMA: Patent without evidence of aneurysm, dissection, vasculitis or significant stenosis. Inflow: Iliacs are within normal limits. Veins: No venous abnormality is noted. Review of the MIP images confirms the above findings. NON-VASCULAR Hepatobiliary: No focal liver  abnormality is seen. No gallstones, gallbladder wall thickening, or biliary dilatation. Pancreas: Pancreas is well visualized and demonstrates some mild peripancreatic inflammatory changes consistent with acute pancreatitis. No evidence of pancreatic necrosis is noted. No pseudocyst is seen. Spleen: Normal in size without focal abnormality. Adrenals/Urinary Tract: Adrenal glands are within normal limits. Kidneys are well visualized bilaterally. No renal calculi or obstructive changes are noted. The bladder is partially distended Stomach/Bowel: No obstructive or inflammatory changes of the colon are noted. The appendix is within normal limits. Small bowel and stomach are unremarkable. Lymphatic: No lymphadenopathy is noted. Reproductive: Prostate is unremarkable. Other: No abdominal wall hernia or abnormality. No abdominopelvic ascites. Musculoskeletal: No acute or significant osseous findings. Review of the MIP images confirms the above findings. IMPRESSION: Changes consistent with acute pancreatitis. No pseudocyst is noted. No pancreatic necrosis is seen. No other focal abnormality is seen. Electronically Signed   By: Violeta Grey M.D.   On: 08/07/2023 19:32    Procedures Procedures    Medications Ordered in ED Medications  amLODipine  (NORVASC ) tablet 10 mg (10 mg Oral Given 08/07/23 2145)    And  irbesartan  (AVAPRO ) tablet 150 mg (150 mg Oral Given 08/07/23 2145)  ondansetron (ZOFRAN) injection 4 mg (4 mg Intravenous Given 08/07/23 1753)  morphine  (PF) 4 MG/ML injection 4 mg (4 mg Intravenous Given 08/07/23 1752)  iohexol  (OMNIPAQUE ) 350 MG/ML injection 100 mL (100 mLs Intravenous Contrast Given 08/07/23 1858)  hydrALAZINE (APRESOLINE) tablet 25 mg (25 mg Oral Given 08/07/23 2029)  hydrochlorothiazide  (HYDRODIURIL ) tablet 12.5 mg (12.5 mg Oral Given 08/07/23 2029)  HYDROmorphone (DILAUDID) injection 1 mg (1 mg Intravenous Given 08/07/23 2028)    ED Course/ Medical Decision Making/ A&P   38 year old here for  evaluation of abdominal pain going into his back.  He is significantly hypertensive with systolic blood pressures greater than 235.  He admits to medication noncompliance with his BP meds.  He does not appear grossly fluid overloaded.  No chest pain or shortness of breath.  Does have some moderate tenderness to his epigastric region into his mid back.  Neurovascularly intact.  Does have chronic EtOH use, few episodes of loose stool without blood.  No vomiting however was had some nausea.  Will plan on labs, imaging and reassess  Labs and imaging personally viewed and interpreted:  CBC leukocytosis 16.1 Metabolic panel potassium 3.4 UA proteinuria, no infection, no blood Ethanol less than 215 Lipase 207 CTA for dissection shows acute pancreatitis  Discussed results with patient, significant other in room.  Will give dose of BP meds as well  as home meds.  He is still having some pain however no nausea.  Will attempt pain control.  Would prefer outpatient management for pancreatitis if he can get his pain controlled.  Patient reassessed.  States his pain is controlled.  He would like to try and go home.  Blood pressure improved however he is still hypertensive.  It it appears on prior visits his elevated blood pressure as well.  He continues to deny any headache, chest pain, shortness of breath.  Low suspicion for hypertensive emergency. I suspect his pain is likely due to EtOH pancreatitis.  We discussed alcohol sensation.  Clear liquid diet over the next few days.  He will need close follow-up with his primary care provider.  Patient reassessed.  Again we discussed admission however he declines.  Short course of pain medicine given as well as antiemetic.  He will return for any worsening symptoms  Patient is nontoxic, nonseptic appearing, in no apparent distress.  Patient's pain and other symptoms adequately managed in emergency department.  Fluid bolus given.  Labs, imaging and vitals reviewed.   Patient does not meet the SIRS or Sepsis criteria.  On repeat exam patient does not have a surgical abdomin and there are no peritoneal signs.  No indication of appendicitis, bowel obstruction, bowel perforation, cholecystitis, diverticulitis, AAA, dissection.    Tolerating p.o. intake here in ED.  The patient has been appropriately medically screened and/or stabilized in the ED. I have low suspicion for any other emergent medical condition which would require further screening, evaluation or treatment in the ED or require inpatient management.  Patient is hemodynamically stable and in no acute distress.  Patient able to ambulate in department prior to ED.  Evaluation does not show acute pathology that would require ongoing or additional emergent interventions while in the emergency department or further inpatient treatment.  I have discussed the diagnosis with the patient and answered all questions.  Pain is been managed while in the emergency department and patient has no further complaints prior to discharge.  Patient is comfortable with plan discussed in room and is stable for discharge at this time.  I have discussed strict return precautions for returning to the emergency department.  Patient was encouraged to follow-up with PCP/specialist refer to at discharge.                                  Medical Decision Making Amount and/or Complexity of Data Reviewed Independent Historian: spouse External Data Reviewed: labs, radiology, ECG and notes. Labs: ordered. Decision-making details documented in ED Course. Radiology: ordered and independent interpretation performed. Decision-making details documented in ED Course.  Risk OTC drugs. Prescription drug management. Parenteral controlled substances. Decision regarding hospitalization. Diagnosis or treatment significantly limited by social determinants of health.          Final Clinical Impression(s) / ED Diagnoses Final diagnoses:   Alcohol-induced acute pancreatitis, unspecified complication status  Hypertensive urgency    Rx / DC Orders ED Discharge Orders          Ordered    oxyCODONE -acetaminophen  (PERCOCET/ROXICET) 5-325 MG tablet  Every 6 hours PRN        08/07/23 2209    ondansetron (ZOFRAN-ODT) 4 MG disintegrating tablet  Every 8 hours PRN        08/07/23 2209              Hortense Cantrall A, PA-C 08/07/23 2214  Guerline Happ A, PA-C 08/07/23 2248    Lind Repine, MD 08/08/23 [id number]

## 2023-08-07 NOTE — Discharge Instructions (Addendum)
 It was a pleasure taking care of you here today  We had discussed admission however you wanted to try home treatment first.  As discussed in the room you have pancreatitis which is likely due to your alcohol use.  I recommend abstaining from alcohol.  You need to trial a clear liquid diet over the next few days, gradually increase of bland foods.  Make sure to take your Blood pressure meds.  Your primary care provider tomorrow and let them know you were seen here in the emergency department you need to keep a close watch of your blood pressure.  Return for any new or worsening symptoms.

## 2023-08-07 NOTE — ED Triage Notes (Signed)
 Pt presents to the ED via EMS from home with complaints of epigastric abdominal pain beginning this morning. Denies N/V/D/ Aox4  EMS Vitals:  BP 204/108 HR 84

## 2023-09-16 ENCOUNTER — Encounter: Payer: Self-pay | Admitting: *Deleted

## 2024-02-20 ENCOUNTER — Ambulatory Visit: Admitting: Student

## 2024-02-20 ENCOUNTER — Ambulatory Visit

## 2024-02-20 VITALS — BP 176/123 | HR 97 | Temp 97.8°F | Resp 24 | Ht 69.0 in | Wt 323.4 lb

## 2024-02-20 DIAGNOSIS — Z720 Tobacco use: Secondary | ICD-10-CM

## 2024-02-20 DIAGNOSIS — Z79899 Other long term (current) drug therapy: Secondary | ICD-10-CM

## 2024-02-20 DIAGNOSIS — R21 Rash and other nonspecific skin eruption: Secondary | ICD-10-CM

## 2024-02-20 DIAGNOSIS — I10 Essential (primary) hypertension: Secondary | ICD-10-CM

## 2024-02-20 DIAGNOSIS — B351 Tinea unguium: Secondary | ICD-10-CM

## 2024-02-20 DIAGNOSIS — H3412 Central retinal artery occlusion, left eye: Secondary | ICD-10-CM | POA: Diagnosis not present

## 2024-02-20 DIAGNOSIS — F17210 Nicotine dependence, cigarettes, uncomplicated: Secondary | ICD-10-CM

## 2024-02-20 MED ORDER — NICOTINE POLACRILEX 2 MG MT GUM: 2.0000 mg | CHEWING_GUM | OROMUCOSAL | 2 refills | Status: AC

## 2024-02-20 MED ORDER — HYDROCHLOROTHIAZIDE 12.5 MG PO CAPS: 12.5000 mg | ORAL_CAPSULE | Freq: Every day | ORAL | 3 refills | Status: DC

## 2024-02-20 MED ORDER — VARENICLINE TARTRATE 1 MG PO TABS: 1.0000 mg | ORAL_TABLET | Freq: Every day | ORAL | 0 refills | Status: AC

## 2024-02-20 MED ORDER — TERBINAFINE HCL 1 % EX CREA: 1.0000 | TOPICAL_CREAM | Freq: Two times a day (BID) | CUTANEOUS | 0 refills | Status: AC

## 2024-02-20 MED ORDER — AMLODIPINE-OLMESARTAN 10-40 MG PO TABS: 1.0000 | ORAL_TABLET | Freq: Every day | ORAL | 12 refills | Status: DC

## 2024-02-20 MED ORDER — ROSUVASTATIN CALCIUM 20 MG PO TABS
20.0000 mg | ORAL_TABLET | Freq: Every day | ORAL | 11 refills | Status: AC
Start: 2024-02-20 — End: 2025-02-19

## 2024-02-20 NOTE — Progress Notes (Signed)
 CC: rash on leg  HPI:  Joel Li is a 38 y.o. male with pertinent past medical history of HTN, Tobacco use disorder, alcohol use disorder, OSA further medical history stated below) and presents today for rash on leg. Please see problem based assessment and plan for additional details.  Last clinic appointment: 03/21/2023 Last ED documented: 08/07/2023: Alcohol induced acute pancreatitis with Hypertensive urgency   Last Pertinent Labs Documented:     Latest Ref Rng & Units 08/07/2023    5:00 PM 03/21/2023    9:44 AM 10/25/2022    3:40 PM  BMP  Glucose 70 - 99 mg/dL 832  888  89   BUN 6 - 20 mg/dL 16  13  17    Creatinine 0.61 - 1.24 mg/dL 8.87  8.91  8.74   BUN/Creat Ratio 9 - 20  12  14    Sodium 135 - 145 mmol/L 135  136  138   Potassium 3.5 - 5.1 mmol/L 3.4  3.9  3.6   Chloride 98 - 111 mmol/L 100  100  102   CO2 22 - 32 mmol/L 24  20  21    Calcium  8.9 - 10.3 mg/dL 9.3  9.2  9.0        Latest Ref Rng & Units 08/07/2023    5:00 PM 10/15/2022    9:05 AM 02/20/2021    6:13 AM  CBC  WBC 4.0 - 10.5 K/uL 16.1  9.2  15.0   Hemoglobin 13.0 - 17.0 g/dL 84.9  88.5  84.4   Hematocrit 39.0 - 52.0 % 43.2  33.5  44.1   Platelets 150 - 400 K/uL 256  234  307     Lab Results  Component Value Date   HGBA1C 5.2 10/25/2022   HGBA1C 5.3 04/04/2020   HGBA1C 5.2 12/10/2018     Past Medical History:  Diagnosis Date   Acute kidney injury 10/25/2022   Excessive daytime sleepiness 12/14/2021   Fluid level behind tympanic membrane of left ear 10/06/2021   History of paranasal sinus congestion 12/14/2021   Hypersomnia with sleep apnea 12/14/2021   Hypertension    Nocturia more than twice per night 12/14/2021   Pancreatitis 03/02/2021   Tobacco use disorder    Vaccine counseling 04/05/2020    No current outpatient medications on file prior to visit.   No current facility-administered medications on file prior to visit.    Family History  Problem Relation Age of Onset   Lung  cancer Father    Hyperlipidemia Father    Cleft palate Brother     Social History   Socioeconomic History   Marital status: Single    Spouse name: Not on file   Number of children: Not on file   Years of education: Not on file   Highest education level: Not on file  Occupational History   Occupation: production designer, theatre/television/film    Comment: produce company  Tobacco Use   Smoking status: Every Day    Current packs/day: 0.50    Average packs/day: 0.5 packs/day for 10.0 years (5.0 ttl pk-yrs)    Types: Cigarettes   Smokeless tobacco: Never   Tobacco comments:    =.5 pkpd  Vaping Use   Vaping status: Never Used  Substance and Sexual Activity   Alcohol use: Not Currently    Alcohol/week: 5.0 standard drinks of alcohol    Types: 5 Shots of liquor per week    Comment: weekly   Drug use: Not Currently    Types:  Marijuana   Sexual activity: Yes  Other Topics Concern   Not on file  Social History Narrative   Not on file   Social Drivers of Health   Financial Resource Strain: Not on file  Food Insecurity: No Food Insecurity (10/25/2022)   Hunger Vital Sign    Worried About Running Out of Food in the Last Year: Never true    Ran Out of Food in the Last Year: Never true  Transportation Needs: No Transportation Needs (10/25/2022)   PRAPARE - Administrator, Civil Service (Medical): No    Lack of Transportation (Non-Medical): No  Physical Activity: Not on file  Stress: Not on file  Social Connections: Moderately Isolated (10/25/2022)   Social Connection and Isolation Panel    Frequency of Communication with Friends and Family: More than three times a week    Frequency of Social Gatherings with Friends and Family: More than three times a week    Attends Religious Services: Never    Database Administrator or Organizations: No    Attends Banker Meetings: Never    Marital Status: Married  Catering Manager Violence: Not At Risk (10/25/2022)   Humiliation, Afraid, Rape, and  Kick questionnaire    Fear of Current or Ex-Partner: No    Emotionally Abused: No    Physically Abused: No    Sexually Abused: No    Review of Systems: Denies CP, SOB, changes in vision, HA  Vitals:   02/20/24 1334 02/20/24 1341  BP: (!) 190/121 (!) 176/123  Pulse: 94 97  Resp: (!) 24   Temp: 97.8 F (36.6 C)   TempSrc: Oral   SpO2: 97%   Weight: (!) 323 lb 6.4 oz (146.7 kg)   Height: 5' 9 (1.753 m)     Physical Exam: Physical Exam Cardiovascular:     Rate and Rhythm: Normal rate and regular rhythm.     Heart sounds: Normal heart sounds. No murmur heard. Pulmonary:     Effort: Pulmonary effort is normal.     Breath sounds: Normal breath sounds.  Skin:    General: Skin is warm and dry.     Findings: Rash (see media; annular appearing pink/red lesion without central clearing with plaques) present.        Assessment & Plan:   Patient seen with Dr. Francesco  Assessment & Plan Fungal toenail infection Ddx based on appearance (see media, circumscribed red lesion with plaques in center without central clearing of coloration): annular eczema vs fungal infection. Denies pruritus or pain. Has increased in size over last couple of months but overall is not bothersome. His sister recommended psoriasis cream, however that was not helpful. No history of eczema. Has visible onychomycosis. Stated he changes his socks and shoes regularly. Works as glass blower/designer.  Without pruritus and with visible fungal infection, will treat as fungal.   - Terbinofine 1% cream ordered, BID for 2 weeks - referral to podiatry placed  Hypertension, unspecified type Primary hypertension Medications: Amlodipine -Olmesartan  10-20 and hydrochlorothiazide  12.5  Has not been taking for 1 month because he went on vacation and forgot to take them and then just forgot to keep taking.  Vitals:   02/20/24 1334 02/20/24 1341  BP: (!) 190/121 (!) 176/123  Counseled patient on importance of BP control and risks  of uncontrolled BP. Denied CP, SOB, vision changes, new HA. Instructed patient to keep a log of BP (instructions attached) and f/u in 2 weeks - continue amlodipine -olmesartan  10-40  mg daily  - continue hydrochlorothiazide  12.5 mg daily  - keep BP log  - F/u in 2 weeks Central retinal artery occlusion of left eye Patient has been nonadherent to all medications, has not been taking ASA 81 mg or Crestor  20 mg daily. Follows with ophthalmology.  - restart ASA 81 mg  - restart Crestor  20 mg daily  Tobacco abuse Smokng <1 pack a day. Patient willing to quit. Counseled on smoking cessation and refilled chantix . Counseled on nicorette  gum.  - prescribed nicorette  gum  - refilled chantix     Orders Placed This Encounter  Procedures   Ambulatory referral to Podiatry    Referral Priority:   Routine    Referral Type:   Consultation    Referral Reason:   Specialty Services Required    Requested Specialty:   Podiatry    Number of Visits Requested:   1     Louretta Tantillo, D.O. [facility] Internal Medicine, PGY-1 Date 02/21/2024 Time 5:42 PM

## 2024-02-20 NOTE — Assessment & Plan Note (Addendum)
 Medications: Amlodipine -Olmesartan  10-20 and hydrochlorothiazide  12.5  Has not been taking for 1 month because he went on vacation and forgot to take them and then just forgot to keep taking.  Vitals:   02/20/24 1334 02/20/24 1341  BP: (!) 190/121 (!) 176/123  Counseled patient on importance of BP control and risks of uncontrolled BP. Denied CP, SOB, vision changes, new HA. Instructed patient to keep a log of BP (instructions attached) and f/u in 2 weeks - continue amlodipine -olmesartan  10-40 mg daily  - continue hydrochlorothiazide  12.5 mg daily  - keep BP log  - F/u in 2 weeks

## 2024-02-20 NOTE — Patient Instructions (Addendum)
 Today we discussed the following medical conditions and plan:   History of stroke:  - take your Aspirin  81 mg daily   Blood Pressure:  - Amlodipine -Olmesartan  10-40 mg daily  - hydrochlorothiazide  12.5 mg daily  - Take your blood pressure twice daily, ideally 1 time 2 hours after you take your medication.  Keep a log of your blood pressure. Keep both feet on the floor with legs uncrossed, keep your arm at the level of your heart, make sure you are relaxed before taking the blood pressure reading - follow up in 2 weeks.   Cholesterol:  - Crestor  20 mg daily   Smoking:  - Here are the instructions for the gum:  For the first 1-6 weeks, you can chew up to 1 piece every 1-2 hours.  For weeks 7-9, you can chew 1 piece every 2-4 hours.  For weeks 10-12, you can chew up to 1 piece every 4-8 hours.  Use at least 9 pieces per day for the first 6 weeks to improve changes of quitting.   Chew the gum slowly until you feel a tingly/peppery test.  Park the gum between your cheek and gum  When the tingle fades, chew again until it returns, then park it again Repeat this for 30 minutes then discard the gum.   Do not eat or drink for 15 minutes before using gum or while chewing it as this can reduce absorption. If you have strong craving, you can use a second piece within the hours, but do not use pieces back to back continuously as this can cause hiccups, heartburn, or nausea.   Common side effects include mouth irritation, jaw soreness, hiccups, heartburn, nausea.   - Chantix  (pill) has been ordered   Rash - cream apply twice daily for 2 weeks  - I sent in referral to foot doctor   We look forward to seeing you next time. Please call our clinic at [phone] if you have any questions or concerns. The best time to call is Monday-Friday from 9am-4pm, but there is someone available 24/7. If you need medication refills, please notify your pharmacy one week in advance and they will send us  a  request.   Thank you for trusting me with your care. Wishing you the best!   Sallyanne Primas, DO  [facility]

## 2024-02-21 NOTE — Assessment & Plan Note (Signed)
 Patient has been nonadherent to all medications, has not been taking ASA 81 mg or Crestor  20 mg daily. Follows with ophthalmology.  - restart ASA 81 mg  - restart Crestor  20 mg daily

## 2024-02-21 NOTE — Assessment & Plan Note (Signed)
 Smokng <1 pack a day. Patient willing to quit. Counseled on smoking cessation and refilled chantix . Counseled on nicorette  gum.  - prescribed nicorette  gum  - refilled chantix 

## 2024-02-22 NOTE — Progress Notes (Signed)
 Internal Medicine Clinic Attending  I was physically present during the key portions of the resident provided service and participated in the medical decision making of patient's management care. I reviewed pertinent patient test results.  The assessment, diagnosis, and plan were formulated together and I agree with the documentation in the resident's note.  Francesco Elsie NOVAK, MD

## 2024-02-24 ENCOUNTER — Encounter: Payer: Self-pay | Admitting: Podiatry

## 2024-02-24 ENCOUNTER — Ambulatory Visit: Admitting: Podiatry

## 2024-02-24 DIAGNOSIS — M79675 Pain in left toe(s): Secondary | ICD-10-CM

## 2024-02-24 DIAGNOSIS — B351 Tinea unguium: Secondary | ICD-10-CM | POA: Diagnosis not present

## 2024-02-24 DIAGNOSIS — M79674 Pain in right toe(s): Secondary | ICD-10-CM

## 2024-02-24 DIAGNOSIS — L600 Ingrowing nail: Secondary | ICD-10-CM

## 2024-02-27 NOTE — Progress Notes (Signed)
 Subjective:   Patient ID: Joel Li, male   DOB: 38 y.o.   MRN: [id number]   HPI Patient presents with severe nail disease 1-5 both feet that are thick and impossible for him to cut.  Patient also has a rash lateral side of his foot that he is being treated for currently by dermatologist.  Patient does not smoke tries to be active does have obesity   Review of Systems  All other systems reviewed and are negative.       Objective:  Physical Exam Vitals and nursing note reviewed.  Constitutional:      Appearance: He is well-developed.  Pulmonary:     Effort: Pulmonary effort is normal.  Musculoskeletal:        General: Normal range of motion.  Skin:    General: Skin is warm.  Neurological:     Mental Status: He is alert.     Neurovascular status found to be intact muscle strength adequate range of motion within normal limits.  Patient is noted to have severely dystrophic deformed nailbeds 1-5 both feet that are painful and possible for him to cut with good digital perfusion well-oriented x 3     Assessment:  Chronic nail disease with thickness 1-5 both feet with obesity is complicating factor and skin manifestations     Plan:  H&P reviewed condition and discussed great detail.  At this point I debrided nailbeds 1-5 both feet no iatrogenic bleeding and I reviewed possibility for nail removal in future.  Continue dermatologist for other condition

## 2024-03-05 ENCOUNTER — Ambulatory Visit

## 2024-03-05 ENCOUNTER — Other Ambulatory Visit: Payer: Self-pay

## 2024-03-05 VITALS — BP 161/95 | HR 98 | Temp 97.5°F | Ht 69.0 in

## 2024-03-05 DIAGNOSIS — R809 Proteinuria, unspecified: Secondary | ICD-10-CM

## 2024-03-05 DIAGNOSIS — H3412 Central retinal artery occlusion, left eye: Secondary | ICD-10-CM

## 2024-03-05 DIAGNOSIS — I10 Essential (primary) hypertension: Secondary | ICD-10-CM

## 2024-03-05 DIAGNOSIS — B351 Tinea unguium: Secondary | ICD-10-CM

## 2024-03-05 MED ORDER — ASPIRIN 81 MG PO TBEC: 81.0000 mg | DELAYED_RELEASE_TABLET | Freq: Every day | ORAL | 0 refills | Status: DC

## 2024-03-05 NOTE — Progress Notes (Signed)
 CC: 2-week follow-up  HPI:  Joel Li is a 38 y.o. male living with a history stated below and presents today for 2-week follow-up. Please see problem based assessment and plan for additional details. Past Medical History:  Diagnosis Date   Acute kidney injury 10/25/2022   Excessive daytime sleepiness 12/14/2021   Fluid level behind tympanic membrane of left ear 10/06/2021   History of paranasal sinus congestion 12/14/2021   Hypersomnia with sleep apnea 12/14/2021   Hypertension    Nocturia more than twice per night 12/14/2021   Pancreatitis 03/02/2021   Tobacco use disorder    Vaccine counseling 04/05/2020    Current Outpatient Medications on File Prior to Visit  Medication Sig Dispense Refill   amLODipine -olmesartan  (AZOR ) 10-40 MG tablet Take 1 tablet by mouth daily. 30 tablet 12   hydrochlorothiazide  (MICROZIDE ) 12.5 MG capsule Take 1 capsule (12.5 mg total) by mouth daily. 31 each 3   nicotine  polacrilex (NICORETTE ) 2 MG gum Take 1 each (2 mg total) by mouth every 4 (four) hours while awake. 100 tablet 2   rosuvastatin  (CRESTOR ) 20 MG tablet Take 1 tablet (20 mg total) by mouth daily. 30 tablet 11   terbinafine  (LAMISIL ) 1 % cream Apply 1 Application topically 2 (two) times daily for 14 days. 30 g 0   varenicline  (CHANTIX ) 1 MG tablet Take 1 tablet (1 mg total) by mouth daily. 62 each 0   No current facility-administered medications on file prior to visit.    Family History  Problem Relation Age of Onset   Lung cancer Father    Hyperlipidemia Father    Cleft palate Brother     Social History   Socioeconomic History   Marital status: Single    Spouse name: Not on file   Number of children: Not on file   Years of education: Not on file   Highest education level: Not on file  Occupational History   Occupation: production designer, theatre/television/film    Comment: produce company  Tobacco Use   Smoking status: Every Day    Current packs/day: 0.50    Average packs/day: 0.5 packs/day for  10.0 years (5.0 ttl pk-yrs)    Types: Cigarettes   Smokeless tobacco: Never   Tobacco comments:    =.5 pkpd  Vaping Use   Vaping status: Never Used  Substance and Sexual Activity   Alcohol use: Not Currently    Alcohol/week: 5.0 standard drinks of alcohol    Types: 5 Shots of liquor per week    Comment: weekly   Drug use: Not Currently    Types: Marijuana   Sexual activity: Yes  Other Topics Concern   Not on file  Social History Narrative   Not on file   Social Drivers of Health   Financial Resource Strain: Not on file  Food Insecurity: No Food Insecurity (10/25/2022)   Hunger Vital Sign    Worried About Running Out of Food in the Last Year: Never true    Ran Out of Food in the Last Year: Never true  Transportation Needs: No Transportation Needs (10/25/2022)   PRAPARE - Administrator, Civil Service (Medical): No    Lack of Transportation (Non-Medical): No  Physical Activity: Not on file  Stress: Not on file  Social Connections: Socially Isolated (03/05/2024)   Social Connection and Isolation Panel    Frequency of Communication with Friends and Family: Once a week    Frequency of Social Gatherings with Friends and Family: Once a week  Attends Religious Services: Never    Active Member of Clubs or Organizations: No    Attends Banker Meetings: Never    Marital Status: Married  Catering Manager Violence: Not At Risk (10/25/2022)   Humiliation, Afraid, Rape, and Kick questionnaire    Fear of Current or Ex-Partner: No    Emotionally Abused: No    Physically Abused: No    Sexually Abused: No    Review of Systems: ROS  Per HPI, assessment, plan Vitals:   03/05/24 1357 03/05/24 1401  BP: (!) 163/103 (!) 161/95  Pulse: 98 98  Temp: (!) 97.5 F (36.4 C)   SpO2: 96%   Height: 5' 9 (1.753 m)     Physical Exam: Physical Exam HENT:     Head: Normocephalic.  Cardiovascular:     Rate and Rhythm: Normal rate and regular rhythm.  Pulmonary:      Effort: Pulmonary effort is normal. No respiratory distress.     Breath sounds: Normal breath sounds.  Skin:    Comments: Pink/red lesion without any central clearing still present on the left leg.  Peeling has improved compared to prior image from last visit.  toenails have been cut.  Neurological:     Mental Status: He is alert.      Assessment & Plan:     Patient seen with Dr. Francesco  Assessment & Plan Hypertension, unspecified type Patient reports he started using his amlodipine -olmesartan  10-20 mg and HCTZ 12.5 mg a little over a week ago.  He has been checking his BP daily however has only documented 2 readings from last week both high: 182/117 and 169/110.  His reports BP was around 159/110 this a.m.  Patient has no CP, SOB, blurry vision or vision changes.  BP checked today clinic was 163/103 with a repeat of 161/95.  As patient only started his medications a week ago, we will need more data points to make any modifications to his medications.  Discussed with patient to continue taking his BP medications and record his BP daily and bringthe log in 2 weeks for an RN BP check.  -Continue amlodipine -olmesartan  10-20 mg daily -Continue HCTZ 12.5 mg daily -CMP today Central retinal artery occlusion of left eye Per charting, patient was asked to start aspirin  and Crestor  during his previous OV.  Patient reports not having started his aspirin  as he ran out but has been taking his Lipitor.  Will check his lipid panel today as it has not been checked past year.   Patient reports he follows up with ophthalmologist every 6 months for his central retinal artery occlusion of the left eye and his last visit was a little more than 6 months ago.  He reports his ophthalmologist instructed him to start taking a higher dose aspirin  which he has not been taking.  Asked patient to currently restart his 81 mg aspirin  follow-up with ophthalmologist in the future for modifications and dosing.  -  Aspirin  81 once daily -Crestor  20 mg daily -Lipid panel today Fungal toenail infection Patient reports he started using his over-the-counter antifungal cream only about a week ago as there was confusion about the cream being prescribed.  He does report an improvement in his lesion on the left leg.  Patient reports that the lesion has not improved in size but it is not as scabby anymore.  Physical exam findings showed mild improvement with less peeling in the left leg lesion.  His infection was likely due to visible onychomycosis for  which he followed up with podiatry on 11/24.  Podiatry debrided 1-5 nailbeds in both feet and mentioned the possibility for nail removal in the future.  Patient to continue using OTC terbinafine  1% cream twice daily for the next 2 weeks.  Will start with oral antifungal if symptoms do not resolve by then. -Continue terbinafine  1% cream twice daily for 2 more weeks -Consider oral antifungal agents if left leg lesion has not improved during 2-week RN BP recheck Proteinuria, unspecified type UA from 08/07/2023 showed proteinuria. Evaluate during future visits.   No orders of the defined types were placed in this encounter.    Rebecka Pion, D.O. [facility], PGY-1 Date 03/05/2024 Time 2:08 PM

## 2024-03-05 NOTE — Patient Instructions (Addendum)
 Please follow instructions as discussed in today's plan: -Continue taking Amlodipine -Olmesartan  10-20mg  daily and hydrochlorothiazide  12.5 for blood pressure -Document your blood pressure daily and bring it with you during your 2-week follow-up visit for a blood pressure check with the nurse -Restart your aspirin  81 mg daily -Use terbinafine  1% cream 2 times a day for your rash.  Let us  know if your rash is not getting better during your 2-week follow-up with the nurse.  We will put you on an oral medication if your symptoms do not get better by then  - We will call you with your lab results from today  Thank You  Rebecka Pion, DO

## 2024-03-06 LAB — COMPREHENSIVE METABOLIC PANEL WITH GFR
ALT: 23 IU/L (ref 0–44)
AST: 18 IU/L (ref 0–40)
Albumin: 4.4 g/dL (ref 4.1–5.1)
Alkaline Phosphatase: 92 IU/L (ref 47–123)
BUN/Creatinine Ratio: 11 (ref 9–20)
BUN: 12 mg/dL (ref 6–20)
Bilirubin Total: 0.4 mg/dL (ref 0.0–1.2)
CO2: 24 mmol/L (ref 20–29)
Calcium: 9.3 mg/dL (ref 8.7–10.2)
Chloride: 98 mmol/L (ref 96–106)
Creatinine, Ser: 1.12 mg/dL (ref 0.76–1.27)
Globulin, Total: 2.6 g/dL (ref 1.5–4.5)
Glucose: 101 mg/dL — ABNORMAL HIGH (ref 70–99)
Potassium: 3.4 mmol/L — ABNORMAL LOW (ref 3.5–5.2)
Sodium: 138 mmol/L (ref 134–144)
Total Protein: 7 g/dL (ref 6.0–8.5)
eGFR: 86 mL/min/1.73 (ref >59)

## 2024-03-06 LAB — LIPID PANEL
Chol/HDL Ratio: 3.4 ratio (ref 0.0–5.0)
Cholesterol, Total: 158 mg/dL (ref 100–199)
HDL: 46 mg/dL (ref >39)
LDL Chol Calc (NIH): 81 mg/dL (ref 0–99)
Triglycerides: 180 mg/dL — ABNORMAL HIGH (ref 0–149)
VLDL Cholesterol Cal: 31 mg/dL (ref 5–40)

## 2024-03-07 NOTE — Assessment & Plan Note (Signed)
 Patient reports he started using his amlodipine -olmesartan  10-20 mg and HCTZ 12.5 mg a little over a week ago.  He has been checking his BP daily however has only documented 2 readings from last week both high: 182/117 and 169/110.  His reports BP was around 159/110 this a.m.  Patient has no CP, SOB, blurry vision or vision changes.  BP checked today clinic was 163/103 with a repeat of 161/95.  As patient only started his medications a week ago, we will need more data points to make any modifications to his medications.  Discussed with patient to continue taking his BP medications and record his BP daily and bringthe log in 2 weeks for an RN BP check.  -Continue amlodipine -olmesartan  10-20 mg daily -Continue HCTZ 12.5 mg daily -CMP today

## 2024-03-07 NOTE — Assessment & Plan Note (Signed)
 Per charting, patient was asked to start aspirin  and Crestor  during his previous OV.  Patient reports not having started his aspirin  as he ran out but has been taking his Lipitor.  Will check his lipid panel today as it has not been checked past year.   Patient reports he follows up with ophthalmologist every 6 months for his central retinal artery occlusion of the left eye and his last visit was a little more than 6 months ago.  He reports his ophthalmologist instructed him to start taking a higher dose aspirin  which he has not been taking.  Asked patient to currently restart his 81 mg aspirin  follow-up with ophthalmologist in the future for modifications and dosing.  - Aspirin  81 once daily -Crestor  20 mg daily -Lipid panel today

## 2024-03-10 NOTE — Progress Notes (Signed)
 Internal Medicine Clinic Attending  I was physically present during the key portions of the resident provided service and participated in the medical decision making of patient's management care. I reviewed pertinent patient test results.  The assessment, diagnosis, and plan were formulated together and I agree with the documentation in the resident's note.  Francesco Elsie NOVAK, MD

## 2024-03-11 ENCOUNTER — Ambulatory Visit: Payer: Self-pay

## 2024-03-11 NOTE — Telephone Encounter (Signed)
 Discussed lab results with pt. Informed him that LDL levels have improved and he can continue taking his crestor  daily. Told him K levels were slightly low and that we will recheck during his 1-week RN BP visit. In the mean time asked pt to increase potassium intake by eating foods such as banana, spinach, beans. Informed him the hydrochlorothiazide  could also cause hypokalemia and that we might d/c it in the future if repeat labs show low K. Asked pt to get his BP cuff during next visit. Pt stated understanding.

## 2024-03-19 ENCOUNTER — Ambulatory Visit

## 2024-03-19 VITALS — BP 144/82 | HR 98 | Temp 97.6°F | Ht 69.0 in | Wt 335.0 lb

## 2024-03-19 DIAGNOSIS — E66813 Obesity, class 3: Secondary | ICD-10-CM

## 2024-03-19 DIAGNOSIS — Z6841 Body Mass Index (BMI) 40.0 and over, adult: Secondary | ICD-10-CM

## 2024-03-19 DIAGNOSIS — B351 Tinea unguium: Secondary | ICD-10-CM

## 2024-03-19 DIAGNOSIS — I10 Essential (primary) hypertension: Secondary | ICD-10-CM

## 2024-03-19 DIAGNOSIS — F17210 Nicotine dependence, cigarettes, uncomplicated: Secondary | ICD-10-CM | POA: Diagnosis not present

## 2024-03-19 DIAGNOSIS — G4733 Obstructive sleep apnea (adult) (pediatric): Secondary | ICD-10-CM | POA: Diagnosis not present

## 2024-03-19 DIAGNOSIS — Z720 Tobacco use: Secondary | ICD-10-CM

## 2024-03-19 DIAGNOSIS — Z83438 Family history of other disorder of lipoprotein metabolism and other lipidemia: Secondary | ICD-10-CM

## 2024-03-19 DIAGNOSIS — B354 Tinea corporis: Secondary | ICD-10-CM

## 2024-03-19 DIAGNOSIS — Z79899 Other long term (current) drug therapy: Secondary | ICD-10-CM | POA: Diagnosis not present

## 2024-03-19 MED ORDER — ZEPBOUND 5 MG/0.5ML ~~LOC~~ SOAJ: 5.0000 mg | SUBCUTANEOUS | 0 refills | Status: AC

## 2024-03-19 MED ORDER — ZEPBOUND 2.5 MG/0.5ML ~~LOC~~ SOAJ: 2.5000 mg | SUBCUTANEOUS | 0 refills | Status: AC

## 2024-03-19 MED ORDER — CLOTRIMAZOLE-BETAMETHASONE 1-0.05 % EX CREA: 1.0000 | TOPICAL_CREAM | Freq: Two times a day (BID) | CUTANEOUS | 0 refills | Status: DC

## 2024-03-19 MED ORDER — ZEPBOUND 7.5 MG/0.5ML ~~LOC~~ SOAJ: 7.5000 mg | SUBCUTANEOUS | 0 refills | Status: AC

## 2024-03-19 MED ORDER — ZEPBOUND 10 MG/0.5ML ~~LOC~~ SOAJ: 10.0000 mg | SUBCUTANEOUS | 0 refills | Status: AC

## 2024-03-19 NOTE — Assessment & Plan Note (Addendum)
 BP Readings from Last 3 Encounters:  03/05/24 (!) 161/95  02/20/24 (!) 176/123  08/07/23 (!) 199/108    He has a history of hypertension. His current regimen is Amlodipine -Olmesartan  10-40mg  daily, hydrochlorothiazide  12.5mg  daily. He reports that he is adherent to his medications. He denies headaches, chest pain, shortness of breath, peripheral edema, and vision changes. Last seen on 12/4 when his BP was 161/95. Olmesartan  dose increased from 20 mg to 40mg  at that time. His K was low at 3.4 and had been low in the past. He has noted elevated renin level in the past with low aldosterone to renin ratio. He has sleep apnea and is *** with his CPAP.  His BP in the office today is ***. Right sided numbness.

## 2024-03-19 NOTE — Patient Instructions (Signed)
 Thank you, Mr. Prentice Singleton, for allowing us  to provide your care today. Today we discussed . . .  > High blood pressure       - Your blood pressure is still high at today's visit.  We will start you on spironolactone 25 mg daily.  This medicine should hopefully also help with your low potassium.  Continue to work on stopping smoking is much as you are able.  If you need any help or refills of your paretic line or nicotine  gum please do not hesitate to contact our office.  Weight loss and activity are both helpful for high blood pressure in addition to losing some weight as well.  Treatment for sleep apnea also helps with blood pressure. > Sleep apnea       - Please try to use your CPAP again.  I have also started you on Zepbound  which should help with your sleep apnea in the long-term and should also help with weight loss as well.  If you notice any undesirable side effects please reach out to us  to see if we can help mitigate those. > Fungal infection       - I have sent you in a cream that you can use twice daily for this.   I have ordered the following labs for you:  Lab Orders  No laboratory test(s) ordered today      Referrals ordered today:   Referral Orders  No referral(s) requested today      Follow up: 1 month    Remember:  Should you have any questions or concerns please call the internal medicine clinic at [phone].     Melvenia Morrison, [facility]

## 2024-03-19 NOTE — Progress Notes (Unsigned)
° °  CC: Routine Follow Up for BP and fungal infection after last office visit 03/05/2024  HPI:  Joel Li is a 38 y.o. male with pertinent PMH of HTN, tobacco use disorder, nocturia, hypokalemia, sleep apnea who presents for follow-up of his BMP. Please see problem based assessment and plan for further history.  ROS  Medications: Current Outpatient Medications  Medication Instructions   amLODipine -olmesartan  (AZOR ) 10-40 MG tablet 1 tablet, Oral, Daily   aspirin  EC 81 mg, Oral, Daily, Swallow whole.   hydrochlorothiazide  (MICROZIDE ) 12.5 mg, Oral, Daily   nicotine  polacrilex (NICORETTE ) 2 mg, Oral, Every 4 hours while awake   rosuvastatin  (CRESTOR ) 20 mg, Oral, Daily   varenicline  (CHANTIX ) 1 mg, Oral, Daily     Physical Exam:  There were no vitals filed for this visit.  Physical Exam    Assessment & Plan:   Assessment & Plan Hypertension, unspecified type BP Readings from Last 3 Encounters:  03/05/24 (!) 161/95  02/20/24 (!) 176/123  08/07/23 (!) 199/108    He has a history of hypertension. His current regimen is Amlodipine -Olmesartan  10-40mg  daily, hydrochlorothiazide  12.5mg  daily. He reports that he is adherent to his medications. He denies headaches, chest pain, shortness of breath, peripheral edema, and vision changes. Last seen on 12/4 when his BP was 161/95. Olmesartan  dose increased from 20 mg to 40mg  at that time. His K was low at 3.4 and had been low in the past. He has noted elevated renin level in the past with low aldosterone to renin ratio. He has sleep apnea and is *** with his CPAP.  His BP in the office today is ***. Right sided numbness.  OSA (obstructive sleep apnea)  Fungal toenail infection   No orders of the defined types were placed in this encounter.    Patient {GC/[id number]::discussed with,seen with} {[id number]/[id number]}  ***HPI deleted ***Routed ***spell check  Melvenia Morrison, MD Internal Medicine  Center Internal Medicine Resident PGY-1 Clinic Phone: [phone] Please contact the on call pager at [phone] for any urgent or emergent needs.

## 2024-03-20 DIAGNOSIS — B354 Tinea corporis: Secondary | ICD-10-CM | POA: Insufficient documentation

## 2024-03-20 NOTE — Assessment & Plan Note (Signed)
 Discussed tobacco cessation.  Patient has had some benefit with his nicotine  gum.  Today we did identify triggers for his smoking.  He does not require any refills of declined at this time, but he has found this somewhat helpful.  - 6 minutes of discussion on tobacco cessation today.

## 2024-03-20 NOTE — Assessment & Plan Note (Signed)
 He has known sleep apnea but has not been wearing his CPAP at home as he finds his mask uncomfortable.  He was informed that this is likely worsening his hypertension and would be a nonpharmacologic way to address his hypertension and potentially lessen the medications that he is on later down the road.  He was advised to contact his sleep medicine physician to discuss if there is any other options for his sleep apnea or other masks that may fit him better.  Until then he will try and use the mask that he has.  In addition to this, we will start him on Zepbound  for treatment of his sleep apnea.  His BMI is 49 at today's visit and he would benefit from the weight loss component of this medication as well.  - Restart CPAP -Prescribed Zepbound  2.5 mg weekly for 1 month, 5 mg weekly for for 1 month, 7.5 mg weekly for 1 month, followed by 10 mg weekly for 1 month.  Could increase after that if the patient is not having any undesirable side effects.

## 2024-03-20 NOTE — Assessment & Plan Note (Signed)
 Regarding the fungal lesion on his left lower extremity, the patient reports that his lesion has improved somewhat and is mildly crusted.  This was thought to be fungal due to onychomycosis of the feet.  He had been using terbinafine  cream twice daily since his last appointment.  On exam today the lesion appears about the same size as it was previously, but does seem to be more flaky.  - Prescribed clotrimazole -betamethasone  cream for the patient today. -May require biopsy of the lesion if it fails to improve with this treatment.

## 2024-03-23 ENCOUNTER — Telehealth: Payer: Self-pay

## 2024-03-23 NOTE — Progress Notes (Signed)
 Internal Medicine Clinic Attending  I was physically present during the key portions of the resident provided service and participated in the medical decision making of patient's management care. I reviewed pertinent patient test results.  The assessment, diagnosis, and plan were formulated together and I agree with the documentation in the resident's note.  Rosan Dayton BROCKS, DO

## 2024-03-23 NOTE — Telephone Encounter (Signed)
 Prior Authorization for patient (Zepbound  2.5MG /0.5ML pen-injectors) came through on cover my meds was submitted with last office notes and sleep study awaiting approval or denial.  [id number]

## 2024-03-23 NOTE — Telephone Encounter (Signed)
 Dear Joel Li, On behalf of [employer], [employer] is responsible for reviewing pharmacy services provided to [facility] members. We received a request from your prescriber for coverage of Zepbound  Inj 2.5/0.5. We reviewed all of the information you and/or your doctor sent to us  and sent the information to an appropriate physician specialist if needed. Unfortunately, we must deny coverage for Zepbound . Why was my request denied? This request was denied because you did not meet the following requirements: The requested medication is not covered because it is not on the listing or formulary of approved drugs for your plan benefit. Please discuss alternative drug therapy with your doctor. The request for coverage for ZEPBOUND  INJ 2.5/0.5, use as directed (2ml per month), is denied. This decision is based on health plan criteria for ZEPBOUND  INJ 2.5/0.5. This medicine is covered only if: All of the following: (1) Submission of medical records documenting all the following: (A) You have at least one previous unsuccessful dietary effort to lose weight. (B) One of the following: (I) You have continued symptoms of obstructive sleep apnea despite adherence to positive airway pressure therapy. Adherence is defined as greater than or equal to 4 hours of use per night for greater than or equal to 70 percent of nights. (II) You are not a candidate for positive airway pressure therapy (for example: upper airway anatomic abnormalities). (2) The drug is prescribed by or in consultation with a sleep specialist. The information provided does not show that you meet the criteria listed above. Reviewed by: R.Ph. [id number] All [employer] trademarks and logos are owned by [employer]. All other brand or product names are trademarks or registered marks of their respective owners. All rights reserved. Page 2 of 6 This denial is based on the following clinical guideline(s): A Zepbound  (tirzepatide ) -  Obstructive Sleep Apnea Only - Non-Formulary - [phone]

## 2024-04-20 ENCOUNTER — Other Ambulatory Visit: Payer: Self-pay

## 2024-04-20 ENCOUNTER — Ambulatory Visit: Payer: Self-pay | Admitting: Student

## 2024-04-20 VITALS — BP 145/91 | HR 100 | Temp 97.8°F | Ht 69.0 in | Wt 331.8 lb

## 2024-04-20 DIAGNOSIS — I10 Essential (primary) hypertension: Secondary | ICD-10-CM | POA: Diagnosis not present

## 2024-04-20 DIAGNOSIS — G4733 Obstructive sleep apnea (adult) (pediatric): Secondary | ICD-10-CM

## 2024-04-20 DIAGNOSIS — H66001 Acute suppurative otitis media without spontaneous rupture of ear drum, right ear: Secondary | ICD-10-CM

## 2024-04-20 DIAGNOSIS — B354 Tinea corporis: Secondary | ICD-10-CM

## 2024-04-20 MED ORDER — OLMESARTAN-AMLODIPINE-HCTZ 40-10-12.5 MG PO TABS: 1.0000 | ORAL_TABLET | Freq: Every day | ORAL | 2 refills | Status: AC

## 2024-04-20 MED ORDER — AMOXICILLIN-POT CLAVULANATE 875-125 MG PO TABS: 1.0000 | ORAL_TABLET | Freq: Two times a day (BID) | ORAL | 0 refills | Status: AC

## 2024-04-20 NOTE — Progress Notes (Unsigned)
 "  Established Patient Office Visit  Subjective   Patient ID: Joel Li, male    DOB: December 02, 1985  Age: 39 y.o. MRN: [id number]  Chief Complaint  Patient presents with   Follow-up    Follow up on bp / left lower leg red area - medication not work / right ear drainage started this morning with pain  # 8     Joel Li is a 39 y.o. male with past medical history of hypertension, OSA, tinea corporis, tobacco use disorder, class III obesity, eustachian tube dysfunction presents today for follow-up on chronic conditions.  Review of Systems:  As per assessment and Plan   Objective:     Vitals:   04/20/24 1551 04/20/24 1555  BP: (!) 144/101 (!) 145/91  Pulse: 100 100  Temp: 97.8 F (36.6 C)   TempSrc: Oral   SpO2: 94%   Weight: (!) 331 lb 12.8 oz (150.5 kg)   Height: 5' 9 (1.753 m)     Physical Exam General: Sitting in chair, no acute distress HENT: +R bulging tympanic membrane with yellowish drainage, no perforation noted. Cardiovascular: Regular rate Pulmonary: Breathing comfortably Abdomen: Soft, nontender, nondistended MSK: +circular maculo-papular rash.        Last metabolic panel Lab Results  Component Value Date   GLUCOSE 141 (H) 04/20/2024   NA 137 04/20/2024   K 3.3 (L) 04/20/2024   CL 98 04/20/2024   CO2 21 04/20/2024   BUN 10 04/20/2024   CREATININE 1.02 04/20/2024   EGFR 96 04/20/2024   CALCIUM  9.1 04/20/2024   PROT 7.0 03/05/2024   ALBUMIN 4.4 03/05/2024   LABGLOB 2.6 03/05/2024   AGRATIO 1.6 12/10/2018   BILITOT 0.4 03/05/2024   ALKPHOS 92 03/05/2024   AST 18 03/05/2024   ALT 23 03/05/2024   ANIONGAP 11 08/07/2023   Last lipids Lab Results  Component Value Date   CHOL 158 03/05/2024   HDL 46 03/05/2024   LDLCALC 81 03/05/2024   TRIG 180 (H) 03/05/2024   CHOLHDL 3.4 03/05/2024   Last hemoglobin A1c Lab Results  Component Value Date   HGBA1C 5.2 10/25/2022      The ASCVD Risk score (Arnett DK, et al., 2019) failed to  calculate for the following reasons:   The 2019 ASCVD risk score is only valid for ages 70 to 67    Assessment & Plan:   Patient discussed with Dr. Lovie Assessment & Plan Hypertension, unspecified type BP Readings from Last 3 Encounters:  04/20/24 (!) 145/91  03/19/24 (!) 144/82  03/05/24 (!) 161/95   BP 145/91, at home SBP 140s and DBP 90s. Home medication consist of Azor  10-40 mg daily and hydrochlorothiazide  12.5 mg daily.  Patient is not taking spironolactone 25 mg daily dose prescribed at the last office visit.  Denies any chest pain, shortness of breath, lower extremity swelling. -Start olmesartan -amlodipine -hydrochlorothiazide  40/10/12.5 mg, 1 tablet by mouth daily - Start spironolactone 25 mg daily - BMP checked during this office visit showed serum creatinine 1.02 (1.12), eGFR 96, potassium 3.3 OSA (obstructive sleep apnea) Patient had a home sleep test on 02/07/2022 that showed severe obstructive sleep apnea with nocturnal hypoxemia.  Concomitant weight loss is recommended for severe sleep apnea. BMI 49; patient reports diet modification with lean meats, low-fat dairy, plant-based proteins, nonstarchy vegetables, whole grains rice and healthy fats.  Patient also reports exercise with 2-3 sessions per week with a low impact cardio. -Patient is advised to start Zepbound  2.5 mg weekly  -Continue diet and  lifestyle modification which includes exercise plan Acute suppurative otitis media of right ear without spontaneous rupture of tympanic membrane, recurrence not specified Has a history of eustachian tube disorder of the left ear, reports that he noted yellow drainage from the right ear this morning, with muffled sounds.  Denies any fevers.  Reports that he had congestion and cough last week that has resolved.  Physical exam is notable for right ear tympanic membrane bulging with yellow drainage and outside crusting.  No tenderness noted on external pinna, or periauricular tenderness.   Does have mild tenderness along the mastoid process. -Augmentin  twice daily for 5 days -Recommended to follow-up with otolaryngologist Tinea corporis Presents today for follow-up on the rash that was noted 6 to 8 weeks ago that required multiple office visits.  Patient reports that he has not seen resolution of the rash, it is actually increased in size.  Denies any pruritus.  Denies any numbness or tingling.  Reports that he has been using click clotrimazole -Betamethasone  cream 2-3 times a day. -Recommended biopsy prior to changing medical treatment; return to clinic tomorrow for shave biopsy     Problem List Items Addressed This Visit       Cardiovascular and Mediastinum   Hypertension - Primary (Chronic)   Relevant Medications   Olmesartan -amLODIPine -HCTZ 40-10-12.5 MG TABS   Other Relevant Orders   Basic metabolic panel with GFR (Completed)    Return in about 1 day (around 04/21/2024), or Skin Biopsy.    Toma Edwards, DO "

## 2024-04-20 NOTE — Patient Instructions (Signed)
 Thank you, [name] for allowing us  to provide your care today. Today we discussed:  For your Blood pressure - Take Olmesartan -amLODIPine -HCTZ tablet, one tablet by mouth daily  - Take Spironolactone 25 mg, one tablet by mouth daily   For your cholesterol -Take aspirin  81 mg daily - Take rosuvastatin  20 mg, 1 tablet by mouth daily  Please come back to the clinic tomorrow so we can biopsy the rash on your leg  I have ordered the following labs for you:   Lab Orders         Basic metabolic panel with GFR      Referrals ordered today:   Referral Orders  No referral(s) requested today     I have ordered the following medication/changed the following medications:   Stop the following medications: Medications Discontinued During This Encounter  Medication Reason   hydrochlorothiazide  (MICROZIDE ) 12.5 MG capsule    amLODipine -olmesartan  (AZOR ) 10-40 MG tablet      Start the following medications: Meds ordered this encounter  Medications   amoxicillin -clavulanate (AUGMENTIN ) 875-125 MG tablet    Sig: Take 1 tablet by mouth 2 (two) times daily for 5 days.    Dispense:  10 tablet    Refill:  0   Olmesartan -amLODIPine -HCTZ 40-10-12.5 MG TABS    Sig: Take 1 tablet by mouth daily.    Dispense:  90 tablet    Refill:  2     Follow up: tomorrow for biopsy     Remember:   Should you have any questions or concerns please call the internal medicine clinic at [phone].     Dr. Toma [facility]

## 2024-04-21 ENCOUNTER — Ambulatory Visit: Payer: Self-pay | Admitting: Student

## 2024-04-21 ENCOUNTER — Other Ambulatory Visit (HOSPITAL_COMMUNITY)
Admission: RE | Admit: 2024-04-21 | Discharge: 2024-04-21 | Disposition: A | Discharge status: Home / Self Care | Source: Ambulatory Visit | Attending: Internal Medicine | Admitting: Internal Medicine

## 2024-04-21 ENCOUNTER — Ambulatory Visit: Admitting: Student

## 2024-04-21 VITALS — BP 141/93 | HR 100 | Temp 98.4°F | Ht 69.0 in | Wt 330.8 lb

## 2024-04-21 DIAGNOSIS — R21 Rash and other nonspecific skin eruption: Secondary | ICD-10-CM

## 2024-04-21 DIAGNOSIS — B354 Tinea corporis: Secondary | ICD-10-CM

## 2024-04-21 LAB — BASIC METABOLIC PANEL WITH GFR
BUN/Creatinine Ratio: 10 (ref 9–20)
BUN: 10 mg/dL (ref 6–20)
CO2: 21 mmol/L (ref 20–29)
Calcium: 9.1 mg/dL (ref 8.7–10.2)
Chloride: 98 mmol/L (ref 96–106)
Creatinine, Ser: 1.02 mg/dL (ref 0.76–1.27)
Glucose: 141 mg/dL — ABNORMAL HIGH (ref 70–99)
Potassium: 3.3 mmol/L — ABNORMAL LOW (ref 3.5–5.2)
Sodium: 137 mmol/L (ref 134–144)
eGFR: 96 mL/min/1.73

## 2024-04-21 MED ORDER — ZEPBOUND 2.5 MG/0.5ML ~~LOC~~ SOAJ: 2.5000 mg | SUBCUTANEOUS | 0 refills | Status: AC

## 2024-04-21 MED ORDER — POTASSIUM CHLORIDE CRYS ER 20 MEQ PO TBCR: 20.0000 meq | EXTENDED_RELEASE_TABLET | Freq: Two times a day (BID) | ORAL | 0 refills | Status: AC

## 2024-04-21 MED ORDER — LIDOCAINE HCL (PF) 1 % IJ SOLN: 2.0000 mL | Freq: Once | INTRAMUSCULAR | Status: AC

## 2024-04-21 MED ORDER — ASPIRIN 81 MG PO TBEC: 81.0000 mg | DELAYED_RELEASE_TABLET | Freq: Every day | ORAL | 3 refills | Status: AC

## 2024-04-21 NOTE — Assessment & Plan Note (Addendum)
 Presents today for follow-up on the rash that was noted 6 to 8 weeks ago that required multiple office visits.  Patient reports that he has not seen resolution of the rash, it is actually increased in size. Denies any pruritus. Denies any numbness or tingling. Reports that he has been using click clotrimazole -Betamethasone  cream 2-3 times a day. Presents today for shave biopsy of the skin.  - Shave biopsy of the skin is performed in office and sample is sent - F/u surgical pathology

## 2024-04-21 NOTE — Assessment & Plan Note (Signed)
 Presents today for follow-up on the rash that was noted 6 to 8 weeks ago that required multiple office visits.  Patient reports that he has not seen resolution of the rash, it is actually increased in size.  Denies any pruritus.  Denies any numbness or tingling.  Reports that he has been using click clotrimazole -Betamethasone  cream 2-3 times a day. -Recommended biopsy prior to changing medical treatment; return to clinic tomorrow for shave biopsy

## 2024-04-21 NOTE — Assessment & Plan Note (Signed)
 Patient had a home sleep test on 02/07/2022 that showed severe obstructive sleep apnea with nocturnal hypoxemia.  Concomitant weight loss is recommended for severe sleep apnea. BMI 49; patient reports diet modification with lean meats, low-fat dairy, plant-based proteins, nonstarchy vegetables, whole grains rice and healthy fats.  Patient also reports exercise with 2-3 sessions per week with a low impact cardio. -Patient is advised to start Zepbound  2.5 mg weekly  -Continue diet and lifestyle modification which includes exercise plan

## 2024-04-21 NOTE — Progress Notes (Unsigned)
" ° °  Established Patient Office Visit  Subjective   Patient ID: Joel Li, male    DOB: February 19, 1986  Age: 39 y.o. MRN: [id number]  Chief Complaint  Patient presents with   Follow-up    Patient here for skin punch bx.    [name] is a 39 y.o. M presents today for skin biopsy   Review of Systems:  As per assessment and Plan   Objective:     Vitals:   04/21/24 1516  BP: (!) 141/93  Pulse: 100  Temp: 98.4 F (36.9 C)  TempSrc: Oral  SpO2: 96%  Weight: (!) 330 lb 12.8 oz (150 kg)  Height: 5' 9 (1.753 m)   Physical Exam General: Sitting in chair, no acute distress Cardiovascular: Regular rate Pulmonary: Breathing comfortably Abdomen: Soft, nontender, nondistended Skin: Circular rash LLE with minimal scales   The 10-year ASCVD risk score (Arnett DK, et al., 2019) is: 3.6%    Assessment & Plan:   Patient seen with Dr. Rosan Assessment & Plan Rash Presents today for follow-up on the rash that was noted 6 to 8 weeks ago that required multiple office visits.  Patient reports that he has not seen resolution of the rash, it is actually increased in size. Denies any pruritus. Denies any numbness or tingling. Reports that he has been using click clotrimazole -Betamethasone  cream 2-3 times a day. Presents today for shave biopsy of the skin.  - Shave biopsy of the skin is performed in office and sample is sent - F/u surgical pathology     Problem List Items Addressed This Visit       Musculoskeletal and Integument   Tinea corporis - Primary   Presents today for follow-up on the rash that was noted 6 to 8 weeks ago that required multiple office visits.  Patient reports that he has not seen resolution of the rash, it is actually increased in size. Denies any pruritus. Denies any numbness or tingling. Reports that he has been using click clotrimazole -Betamethasone  cream 2-3 times a day. Presents today for shave biopsy of the skin.  - Shave biopsy of the skin is performed  in office and sample is sent - F/u surgical pathology       Relevant Medications   lidocaine  (PF) (XYLOCAINE ) 1 % injection 2 mL    Return if symptoms worsen or fail to improve.    Toma Edwards, DO "

## 2024-04-21 NOTE — Assessment & Plan Note (Signed)
 BP Readings from Last 3 Encounters:  04/20/24 (!) 145/91  03/19/24 (!) 144/82  03/05/24 (!) 161/95   BP 145/91, at home SBP 140s and DBP 90s. Home medication consist of Azor  10-40 mg daily and hydrochlorothiazide  12.5 mg daily.  Patient is not taking spironolactone 25 mg daily dose prescribed at the last office visit.  Denies any chest pain, shortness of breath, lower extremity swelling. -Start olmesartan -amlodipine -hydrochlorothiazide  40/10/12.5 mg, 1 tablet by mouth daily - Start spironolactone 25 mg daily - BMP checked during this office visit showed serum creatinine 1.02 (1.12), eGFR 96, potassium 3.3

## 2024-04-21 NOTE — Patient Instructions (Signed)
 Thank you, [name] for allowing us  to provide your care today. Today we discussed:  Please keep the bandage on for 48 hours, then remove and apply 2 x 2 gauze with band aide.   I will call you for lab result   Referrals ordered today:   Referral Orders  No referral(s) requested today     I have ordered the following medication/changed the following medications:   Stop the following medications: Medications Discontinued During This Encounter  Medication Reason   aspirin  EC 81 MG tablet Reorder     Start the following medications: Meds ordered this encounter  Medications   aspirin  EC 81 MG tablet    Sig: Take 1 tablet (81 mg total) by mouth daily. Swallow whole.    Dispense:  90 tablet    Refill:  3   lidocaine  (PF) (XYLOCAINE ) 1 % injection 2 mL     Follow up: as needed    Remember:   Should you have any questions or concerns please call the internal medicine clinic at [phone].     Dr. Toma [facility]

## 2024-04-21 NOTE — Assessment & Plan Note (Signed)
 Has a history of eustachian tube disorder of the left ear, reports that he noted yellow drainage from the right ear this morning, with muffled sounds.  Denies any fevers.  Reports that he had congestion and cough last week that has resolved.  Physical exam is notable for right ear tympanic membrane bulging with yellow drainage and outside crusting.  No tenderness noted on external pinna, or periauricular tenderness.  Does have mild tenderness along the mastoid process. -Augmentin  twice daily for 5 days -Recommended to follow-up with otolaryngologist

## 2024-04-24 LAB — SURGICAL PATHOLOGY

## 2024-04-29 ENCOUNTER — Ambulatory Visit: Payer: Self-pay | Admitting: Student

## 2024-04-30 MED ORDER — CLOBETASOL PROPIONATE 0.05 % EX OINT: 1.0000 | TOPICAL_OINTMENT | Freq: Two times a day (BID) | CUTANEOUS | 0 refills | Status: AC

## 2024-04-30 NOTE — Progress Notes (Signed)
 Internal Medicine Clinic Attending  Case discussed with the resident at the time of the visit.  We reviewed the resident's history and exam and pertinent patient test results.  I agree with the assessment, diagnosis, and plan of care documented in the resident's note.

## 2024-05-02 NOTE — Progress Notes (Signed)
 Internal Medicine Clinic Attending  Case discussed with the resident at the time of the visit.  We reviewed the resident's history and exam and pertinent patient test results.  I agree with the assessment, diagnosis, and plan of care documented in the resident's note.
